# Patient Record
Sex: Female | Born: 1935 | Race: White | Hispanic: No | Marital: Married | Smoking: Former smoker
Health system: Southern US, Community
[De-identification: ages and names within clinical notes are randomized; demographics above are authoritative.]

## PROBLEM LIST (undated history)

## (undated) DIAGNOSIS — K5900 Constipation, unspecified: Secondary | ICD-10-CM

## (undated) DIAGNOSIS — K635 Polyp of colon: Secondary | ICD-10-CM

## (undated) DIAGNOSIS — G309 Alzheimer's disease, unspecified: Secondary | ICD-10-CM

## (undated) DIAGNOSIS — F32A Depression, unspecified: Secondary | ICD-10-CM

## (undated) DIAGNOSIS — F39 Unspecified mood [affective] disorder: Secondary | ICD-10-CM

## (undated) DIAGNOSIS — C44310 Basal cell carcinoma of skin of unspecified parts of face: Secondary | ICD-10-CM

## (undated) DIAGNOSIS — K5790 Diverticulosis of intestine, part unspecified, without perforation or abscess without bleeding: Secondary | ICD-10-CM

## (undated) DIAGNOSIS — Z8669 Personal history of other diseases of the nervous system and sense organs: Secondary | ICD-10-CM

## (undated) DIAGNOSIS — G40909 Epilepsy, unspecified, not intractable, without status epilepticus: Secondary | ICD-10-CM

## (undated) DIAGNOSIS — M810 Age-related osteoporosis without current pathological fracture: Secondary | ICD-10-CM

## (undated) DIAGNOSIS — H11001 Unspecified pterygium of right eye: Secondary | ICD-10-CM

## (undated) DIAGNOSIS — K219 Gastro-esophageal reflux disease without esophagitis: Secondary | ICD-10-CM

## (undated) DIAGNOSIS — M4800 Spinal stenosis, site unspecified: Secondary | ICD-10-CM

## (undated) DIAGNOSIS — F329 Major depressive disorder, single episode, unspecified: Secondary | ICD-10-CM

## (undated) DIAGNOSIS — I10 Essential (primary) hypertension: Secondary | ICD-10-CM

## (undated) DIAGNOSIS — M1990 Unspecified osteoarthritis, unspecified site: Secondary | ICD-10-CM

## (undated) DIAGNOSIS — F0280 Dementia in other diseases classified elsewhere without behavioral disturbance: Secondary | ICD-10-CM

## (undated) DIAGNOSIS — M549 Dorsalgia, unspecified: Secondary | ICD-10-CM

## (undated) DIAGNOSIS — R2681 Unsteadiness on feet: Secondary | ICD-10-CM

## (undated) DIAGNOSIS — E785 Hyperlipidemia, unspecified: Secondary | ICD-10-CM

## (undated) HISTORY — DX: Personal history of other diseases of the nervous system and sense organs: Z86.69

## (undated) HISTORY — DX: Age-related osteoporosis without current pathological fracture: M81.0

## (undated) HISTORY — DX: Dementia in other diseases classified elsewhere, unspecified severity, without behavioral disturbance, psychotic disturbance, mood disturbance, and anxiety: F02.80

## (undated) HISTORY — DX: Alzheimer's disease, unspecified: G30.9

## (undated) HISTORY — DX: Unspecified mood (affective) disorder: F39

## (undated) HISTORY — DX: Gastro-esophageal reflux disease without esophagitis: K21.9

## (undated) HISTORY — DX: Essential (primary) hypertension: I10

## (undated) HISTORY — DX: Basal cell carcinoma of skin of unspecified parts of face: C44.310

## (undated) HISTORY — DX: Diverticulosis of intestine, part unspecified, without perforation or abscess without bleeding: K57.90

## (undated) HISTORY — DX: Dorsalgia, unspecified: M54.9

## (undated) HISTORY — DX: Unsteadiness on feet: R26.81

## (undated) HISTORY — DX: Polyp of colon: K63.5

## (undated) HISTORY — DX: Dementia in other diseases classified elsewhere without behavioral disturbance: F02.80

## (undated) HISTORY — DX: Unspecified pterygium of right eye: H11.001

## (undated) HISTORY — DX: Epilepsy, unspecified, not intractable, without status epilepticus: G40.909

## (undated) HISTORY — DX: Spinal stenosis, site unspecified: M48.00

## (undated) HISTORY — DX: Major depressive disorder, single episode, unspecified: F32.9

## (undated) HISTORY — PX: CATARACT EXTRACTION: SUR2

## (undated) HISTORY — DX: Hyperlipidemia, unspecified: E78.5

## (undated) HISTORY — DX: Constipation, unspecified: K59.00

## (undated) HISTORY — DX: Depression, unspecified: F32.A

## (undated) HISTORY — DX: Unspecified osteoarthritis, unspecified site: M19.90

---
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
## 1998-01-13 ENCOUNTER — Other Ambulatory Visit: Admission: RE | Admit: 1998-01-13 | Discharge: 1998-01-13 | Payer: Self-pay | Admitting: Endocrinology

## 1998-06-22 ENCOUNTER — Encounter: Admission: RE | Admit: 1998-06-22 | Discharge: 1998-07-07 | Payer: Self-pay | Admitting: Anesthesiology

## 1998-12-02 ENCOUNTER — Emergency Department (HOSPITAL_COMMUNITY): Admission: EM | Admit: 1998-12-02 | Discharge: 1998-12-03 | Payer: Self-pay | Admitting: Emergency Medicine

## 1999-05-14 ENCOUNTER — Encounter: Payer: Self-pay | Admitting: Gastroenterology

## 1999-05-14 ENCOUNTER — Ambulatory Visit (HOSPITAL_COMMUNITY): Admission: RE | Admit: 1999-05-14 | Discharge: 1999-05-14 | Payer: Self-pay | Admitting: Gastroenterology

## 1999-08-12 ENCOUNTER — Encounter: Admission: RE | Admit: 1999-08-12 | Discharge: 1999-08-12 | Payer: Self-pay

## 1999-08-23 ENCOUNTER — Encounter: Payer: Self-pay | Admitting: Internal Medicine

## 1999-08-23 ENCOUNTER — Encounter: Admission: RE | Admit: 1999-08-23 | Discharge: 1999-08-23 | Payer: Self-pay | Admitting: Internal Medicine

## 2000-10-24 ENCOUNTER — Other Ambulatory Visit: Admission: RE | Admit: 2000-10-24 | Discharge: 2000-10-24 | Payer: Self-pay | Admitting: Internal Medicine

## 2001-01-29 ENCOUNTER — Encounter: Admission: RE | Admit: 2001-01-29 | Discharge: 2001-03-06 | Payer: Self-pay | Admitting: Neurology

## 2001-04-23 ENCOUNTER — Encounter: Admission: RE | Admit: 2001-04-23 | Discharge: 2001-05-31 | Payer: Self-pay | Admitting: Neurology

## 2002-01-18 ENCOUNTER — Encounter: Payer: Self-pay | Admitting: Internal Medicine

## 2002-02-01 HISTORY — PX: COLONOSCOPY: SHX174

## 2002-09-04 ENCOUNTER — Encounter: Admission: RE | Admit: 2002-09-04 | Discharge: 2002-12-03 | Payer: Self-pay

## 2002-12-17 ENCOUNTER — Encounter
Admission: RE | Admit: 2002-12-17 | Discharge: 2003-03-17 | Payer: Self-pay | Admitting: Physical Medicine & Rehabilitation

## 2003-01-06 ENCOUNTER — Encounter: Payer: Self-pay | Admitting: Physical Medicine & Rehabilitation

## 2003-01-06 ENCOUNTER — Ambulatory Visit (HOSPITAL_COMMUNITY)
Admission: RE | Admit: 2003-01-06 | Discharge: 2003-01-06 | Payer: Self-pay | Admitting: Physical Medicine & Rehabilitation

## 2003-01-17 ENCOUNTER — Encounter: Payer: Self-pay | Admitting: Physical Medicine & Rehabilitation

## 2003-01-17 ENCOUNTER — Encounter
Admission: RE | Admit: 2003-01-17 | Discharge: 2003-01-17 | Payer: Self-pay | Admitting: Physical Medicine & Rehabilitation

## 2003-01-17 ENCOUNTER — Encounter: Payer: Self-pay | Admitting: Diagnostic Radiology

## 2003-02-05 ENCOUNTER — Encounter
Admission: RE | Admit: 2003-02-05 | Discharge: 2003-02-05 | Payer: Self-pay | Admitting: Physical Medicine & Rehabilitation

## 2003-02-05 ENCOUNTER — Encounter: Payer: Self-pay | Admitting: Physical Medicine & Rehabilitation

## 2003-02-20 ENCOUNTER — Encounter
Admission: RE | Admit: 2003-02-20 | Discharge: 2003-02-20 | Payer: Self-pay | Admitting: Physical Medicine & Rehabilitation

## 2003-02-20 ENCOUNTER — Encounter: Payer: Self-pay | Admitting: Physical Medicine & Rehabilitation

## 2003-03-04 ENCOUNTER — Encounter: Payer: Self-pay | Admitting: Physical Medicine & Rehabilitation

## 2003-03-04 ENCOUNTER — Encounter
Admission: RE | Admit: 2003-03-04 | Discharge: 2003-03-04 | Payer: Self-pay | Admitting: Physical Medicine & Rehabilitation

## 2004-04-10 ENCOUNTER — Ambulatory Visit (HOSPITAL_COMMUNITY): Admission: EM | Admit: 2004-04-10 | Discharge: 2004-04-10 | Payer: Self-pay | Admitting: Emergency Medicine

## 2004-04-10 ENCOUNTER — Encounter (INDEPENDENT_AMBULATORY_CARE_PROVIDER_SITE_OTHER): Payer: Self-pay | Admitting: Specialist

## 2004-04-26 ENCOUNTER — Ambulatory Visit (HOSPITAL_COMMUNITY): Admission: RE | Admit: 2004-04-26 | Discharge: 2004-04-26 | Payer: Self-pay | Admitting: Gastroenterology

## 2004-05-17 ENCOUNTER — Ambulatory Visit (HOSPITAL_COMMUNITY): Admission: RE | Admit: 2004-05-17 | Discharge: 2004-05-17 | Payer: Self-pay | Admitting: Gastroenterology

## 2004-08-11 ENCOUNTER — Ambulatory Visit: Payer: Self-pay | Admitting: Internal Medicine

## 2004-08-13 ENCOUNTER — Ambulatory Visit: Payer: Self-pay | Admitting: Internal Medicine

## 2004-08-16 ENCOUNTER — Ambulatory Visit: Payer: Self-pay | Admitting: Internal Medicine

## 2004-09-06 ENCOUNTER — Ambulatory Visit: Payer: Self-pay | Admitting: Internal Medicine

## 2004-10-11 ENCOUNTER — Ambulatory Visit (HOSPITAL_COMMUNITY): Admission: RE | Admit: 2004-10-11 | Discharge: 2004-10-11 | Payer: Self-pay | Admitting: Gastroenterology

## 2004-10-11 ENCOUNTER — Ambulatory Visit: Payer: Self-pay | Admitting: Gastroenterology

## 2004-11-09 ENCOUNTER — Ambulatory Visit: Payer: Self-pay | Admitting: Gastroenterology

## 2004-11-30 ENCOUNTER — Ambulatory Visit: Payer: Self-pay | Admitting: Internal Medicine

## 2005-01-11 ENCOUNTER — Ambulatory Visit: Payer: Self-pay | Admitting: Internal Medicine

## 2005-02-10 ENCOUNTER — Ambulatory Visit: Payer: Self-pay | Admitting: Internal Medicine

## 2005-03-01 ENCOUNTER — Ambulatory Visit: Payer: Self-pay | Admitting: Internal Medicine

## 2005-04-13 ENCOUNTER — Ambulatory Visit: Payer: Self-pay | Admitting: Internal Medicine

## 2005-05-25 ENCOUNTER — Ambulatory Visit: Payer: Self-pay | Admitting: Gastroenterology

## 2005-05-31 ENCOUNTER — Ambulatory Visit (HOSPITAL_COMMUNITY): Admission: RE | Admit: 2005-05-31 | Discharge: 2005-05-31 | Payer: Self-pay | Admitting: Gastroenterology

## 2005-07-05 ENCOUNTER — Ambulatory Visit: Payer: Self-pay | Admitting: Gastroenterology

## 2005-07-05 HISTORY — PX: ESOPHAGOGASTRODUODENOSCOPY: SHX1529

## 2005-07-14 ENCOUNTER — Ambulatory Visit: Payer: Self-pay | Admitting: Internal Medicine

## 2005-09-26 HISTORY — PX: EPIDURAL BLOCK INJECTION: SHX1516

## 2005-10-05 ENCOUNTER — Ambulatory Visit: Payer: Self-pay | Admitting: Internal Medicine

## 2005-10-09 ENCOUNTER — Encounter: Admission: RE | Admit: 2005-10-09 | Discharge: 2005-10-09 | Payer: Self-pay | Admitting: Internal Medicine

## 2005-10-11 ENCOUNTER — Encounter: Admission: RE | Admit: 2005-10-11 | Discharge: 2005-11-09 | Payer: Self-pay | Admitting: Internal Medicine

## 2005-10-12 ENCOUNTER — Ambulatory Visit: Payer: Self-pay | Admitting: Internal Medicine

## 2005-11-07 ENCOUNTER — Ambulatory Visit: Payer: Self-pay | Admitting: Internal Medicine

## 2005-11-16 ENCOUNTER — Ambulatory Visit: Payer: Self-pay | Admitting: Internal Medicine

## 2005-11-18 ENCOUNTER — Encounter: Admission: RE | Admit: 2005-11-18 | Discharge: 2005-11-18 | Payer: Self-pay | Admitting: Internal Medicine

## 2005-11-25 ENCOUNTER — Ambulatory Visit: Payer: Self-pay | Admitting: Internal Medicine

## 2005-12-02 ENCOUNTER — Encounter: Admission: RE | Admit: 2005-12-02 | Discharge: 2005-12-02 | Payer: Self-pay | Admitting: Internal Medicine

## 2005-12-19 ENCOUNTER — Encounter: Admission: RE | Admit: 2005-12-19 | Discharge: 2005-12-19 | Payer: Self-pay | Admitting: Internal Medicine

## 2006-01-11 ENCOUNTER — Ambulatory Visit: Payer: Self-pay | Admitting: Internal Medicine

## 2006-01-31 ENCOUNTER — Ambulatory Visit: Payer: Self-pay | Admitting: Internal Medicine

## 2006-02-24 ENCOUNTER — Ambulatory Visit: Payer: Self-pay | Admitting: Internal Medicine

## 2006-03-15 ENCOUNTER — Ambulatory Visit: Payer: Self-pay | Admitting: Internal Medicine

## 2006-03-24 ENCOUNTER — Ambulatory Visit: Payer: Self-pay | Admitting: Internal Medicine

## 2006-04-10 ENCOUNTER — Ambulatory Visit: Payer: Self-pay | Admitting: Internal Medicine

## 2006-05-01 ENCOUNTER — Ambulatory Visit: Payer: Self-pay

## 2006-05-15 ENCOUNTER — Ambulatory Visit: Payer: Self-pay | Admitting: Gastroenterology

## 2006-06-09 ENCOUNTER — Ambulatory Visit: Payer: Self-pay | Admitting: Internal Medicine

## 2006-06-23 ENCOUNTER — Ambulatory Visit: Payer: Self-pay | Admitting: Internal Medicine

## 2006-06-30 ENCOUNTER — Ambulatory Visit: Payer: Self-pay | Admitting: Family Medicine

## 2006-07-30 ENCOUNTER — Encounter
Admission: RE | Admit: 2006-07-30 | Discharge: 2006-07-30 | Payer: Self-pay | Admitting: Physical Medicine and Rehabilitation

## 2006-08-01 ENCOUNTER — Ambulatory Visit: Payer: Self-pay | Admitting: Internal Medicine

## 2006-08-11 ENCOUNTER — Encounter
Admission: RE | Admit: 2006-08-11 | Discharge: 2006-08-11 | Payer: Self-pay | Admitting: Physical Medicine and Rehabilitation

## 2006-09-12 ENCOUNTER — Ambulatory Visit: Payer: Self-pay | Admitting: Internal Medicine

## 2006-09-12 LAB — CONVERTED CEMR LAB
BUN: 11 mg/dL (ref 6–23)
Basophils Absolute: 0 10*3/uL (ref 0.0–0.1)
Basophils Relative: 0.1 % (ref 0.0–1.0)
CO2: 29 meq/L (ref 19–32)
Calcium: 9.6 mg/dL (ref 8.4–10.5)
Chloride: 96 meq/L (ref 96–112)
Creatinine, Ser: 0.9 mg/dL (ref 0.4–1.2)
Eosinophil percent: 1.6 % (ref 0.0–5.0)
Folate: 17.3 ng/mL
GFR calc non Af Amer: 64 mL/min
Glomerular Filtration Rate, Af Am: 77 mL/min/{1.73_m2}
Glucose, Bld: 114 mg/dL — ABNORMAL HIGH (ref 70–99)
HCT: 45.2 % (ref 36.0–46.0)
Hemoglobin: 14.9 g/dL (ref 12.0–15.0)
Lymphocytes Relative: 24.4 % (ref 12.0–46.0)
MCHC: 33.1 g/dL (ref 30.0–36.0)
MCV: 94 fL (ref 78.0–100.0)
Monocytes Absolute: 0.3 10*3/uL (ref 0.2–0.7)
Monocytes Relative: 6.8 % (ref 3.0–11.0)
Neutro Abs: 2.8 10*3/uL (ref 1.4–7.7)
Neutrophils Relative %: 67.1 % (ref 43.0–77.0)
Platelets: 234 10*3/uL (ref 150–400)
Potassium: 4.3 meq/L (ref 3.5–5.1)
RBC: 4.81 M/uL (ref 3.87–5.11)
RDW: 12.3 % (ref 11.5–14.6)
Sodium: 134 meq/L — ABNORMAL LOW (ref 135–145)
TSH: 1.17 microintl units/mL (ref 0.35–5.50)
Vitamin B-12: 236 pg/mL (ref 211–911)
WBC: 4.2 10*3/uL — ABNORMAL LOW (ref 4.5–10.5)

## 2006-10-13 ENCOUNTER — Ambulatory Visit: Payer: Self-pay | Admitting: Internal Medicine

## 2006-10-13 LAB — CONVERTED CEMR LAB
ALT: 15 units/L (ref 0–40)
AST: 23 units/L (ref 0–37)
Albumin: 3.9 g/dL (ref 3.5–5.2)
Alkaline Phosphatase: 75 units/L (ref 39–117)
BUN: 15 mg/dL (ref 6–23)
Bilirubin, Direct: 0.1 mg/dL (ref 0.0–0.3)
CO2: 29 meq/L (ref 19–32)
Calcium: 9.8 mg/dL (ref 8.4–10.5)
Chloride: 102 meq/L (ref 96–112)
Cholesterol: 259 mg/dL (ref 0–200)
Creatinine, Ser: 1 mg/dL (ref 0.4–1.2)
Direct LDL: 112 mg/dL
GFR calc Af Amer: 69 mL/min
GFR calc non Af Amer: 57 mL/min
Glucose, Bld: 97 mg/dL (ref 70–99)
HDL: 109.5 mg/dL (ref >39.0)
Potassium: 4.5 meq/L (ref 3.5–5.1)
Sodium: 138 meq/L (ref 135–145)
Total Bilirubin: 0.5 mg/dL (ref 0.3–1.2)
Total CHOL/HDL Ratio: 2.4
Total Protein: 6.5 g/dL (ref 6.0–8.3)
Triglycerides: 72 mg/dL (ref 0–149)
VLDL: 14 mg/dL (ref 0–40)

## 2006-12-01 ENCOUNTER — Ambulatory Visit: Payer: Self-pay | Admitting: Internal Medicine

## 2006-12-27 ENCOUNTER — Ambulatory Visit: Payer: Self-pay | Admitting: Internal Medicine

## 2007-03-26 ENCOUNTER — Ambulatory Visit: Payer: Self-pay | Admitting: Internal Medicine

## 2007-04-04 IMAGING — CT CT T SPINE W/O CM
4 series · 16 of 33 positions shown, 19 images · IV contrast (agent unspecified)
Comparison: None.

CLINICAL DATA: Back pain for several years.  No history of trauma.  Question HNP.
CT THORACIC SPINE WITHOUT CONTRAST:
TECHNIQUE: Multidetector CT imaging of the thoracic spine was performed.  Multiplanar CT image reconstructions were also generated.

[Series 100: t-spine w/o · axial · non-contrast · 0.39mm/px · z∈[-233,-138]mm · 3 of 115 slices shown (1 of 2)]
[im 20/115  bone]
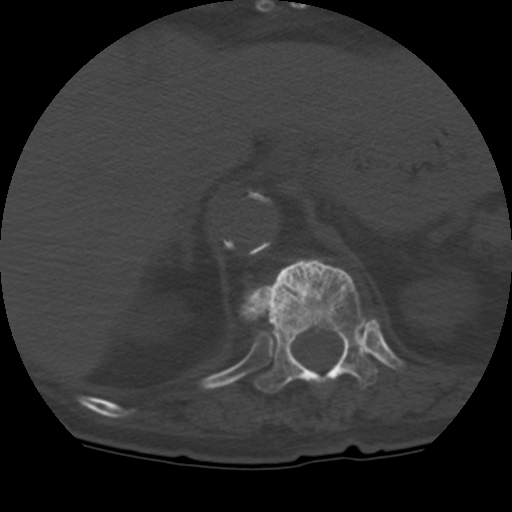
[im 39/115  bone]
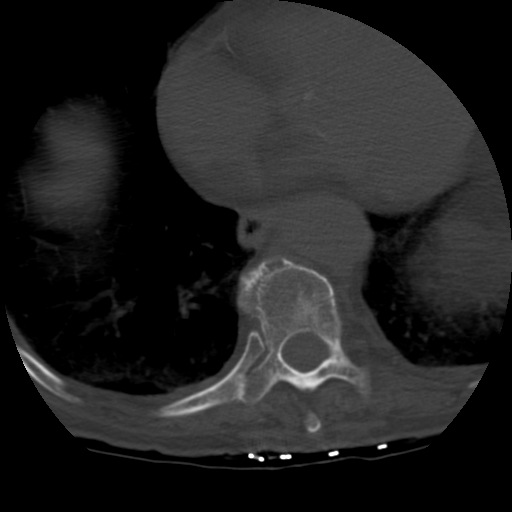
[im 58/115  bone]
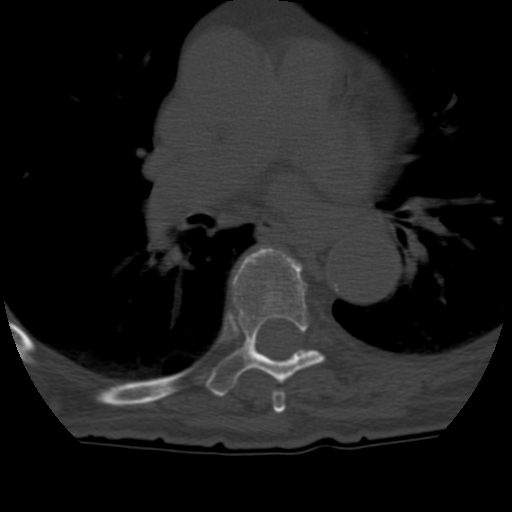

[Series 100: t-spine w/o · axial · non-contrast · 0.39mm/px · z∈[-233,-43]mm · 5 of 115 slices shown, 7 images (2 of 2)]
[im 20/115  soft-tissue]
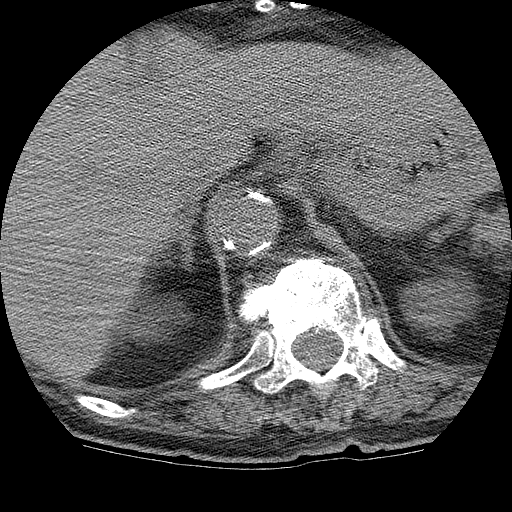
[im 20/115  bone]
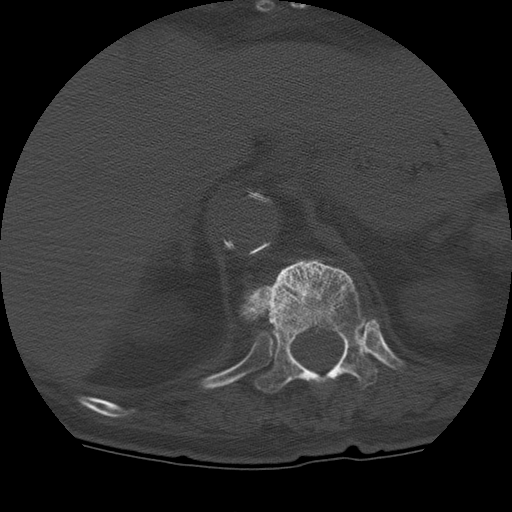
[im 39/115  bone]
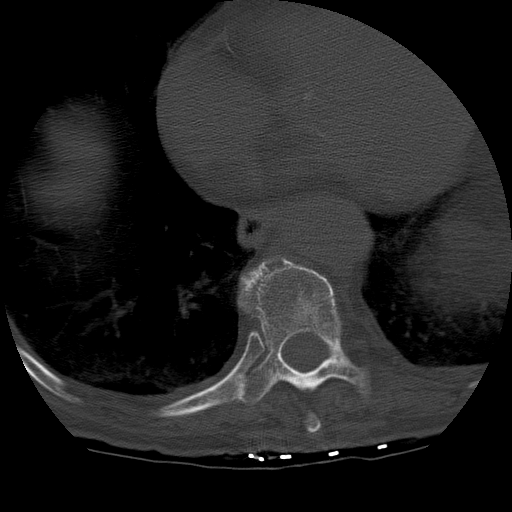
[im 58/115  bone]
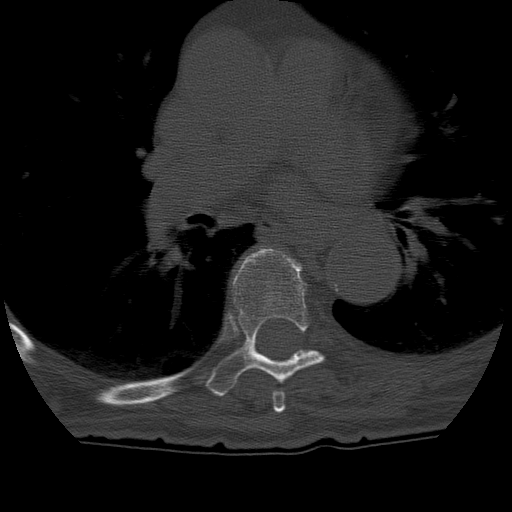
[im 77/115  bone]
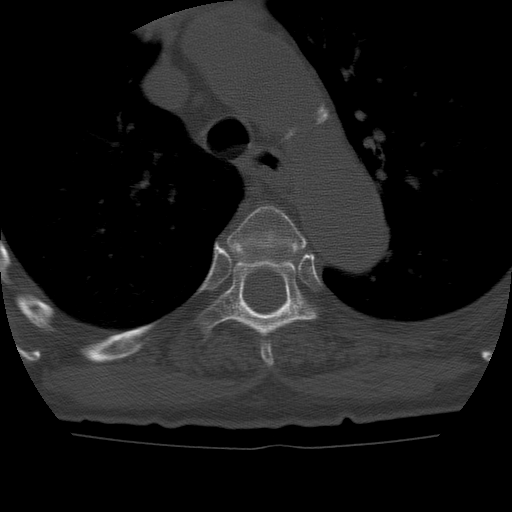
[im 96/115  soft-tissue]
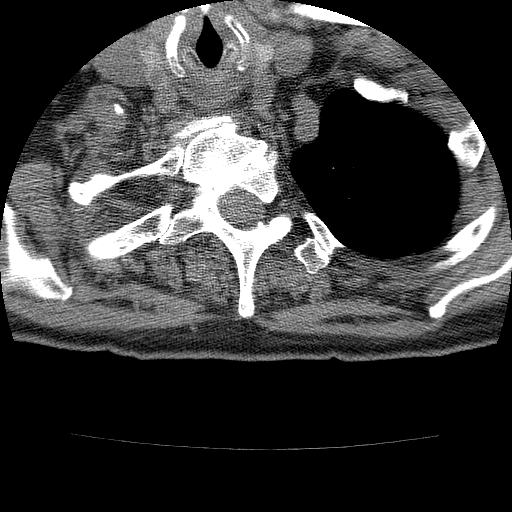
[im 96/115  bone]
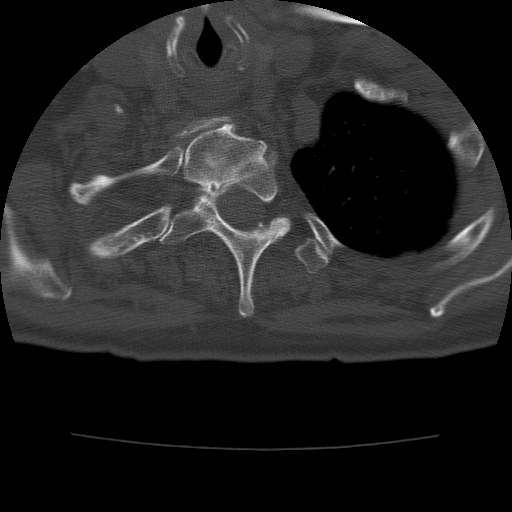

[Series 103: sagittal t-spine · sagittal · 0.56mm/px · 5 of 49 slices shown, 6 images]
[im 17/49  bone]
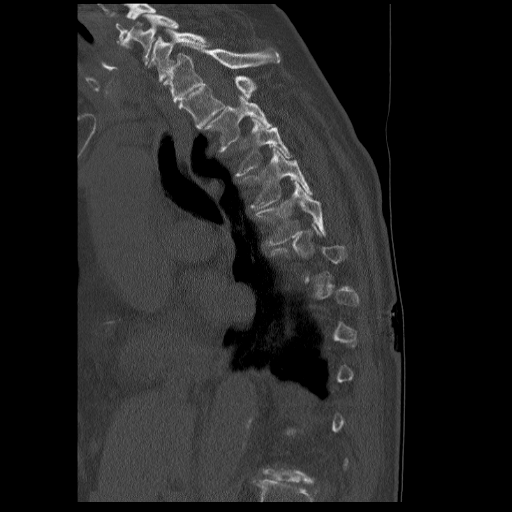
[im 21/49  bone]
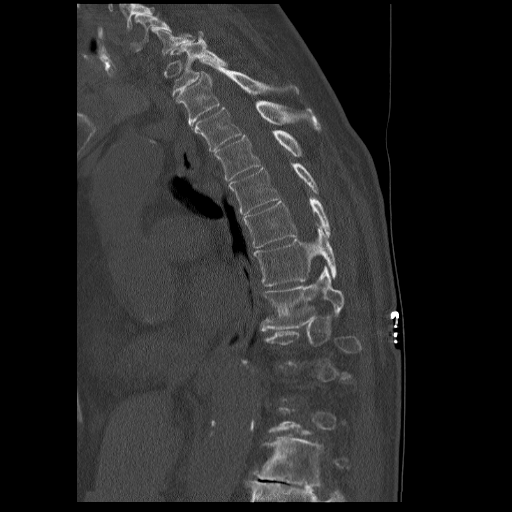
[im 25/49  soft-tissue]
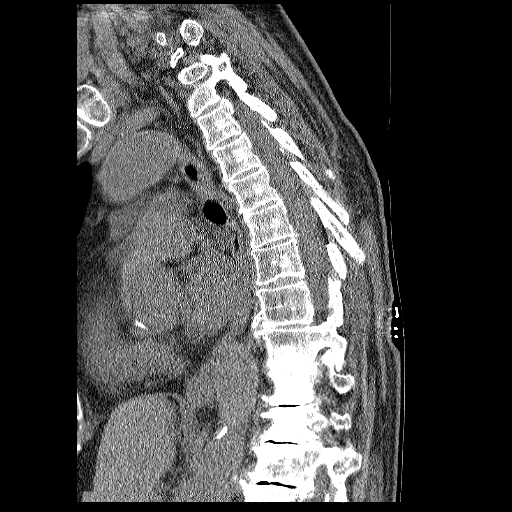
[im 25/49  bone]
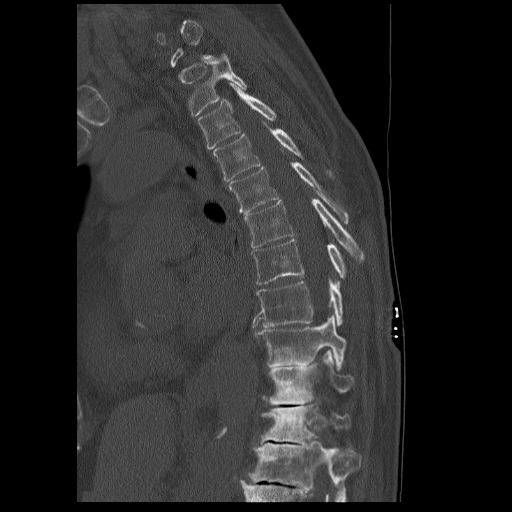
[im 29/49  bone]
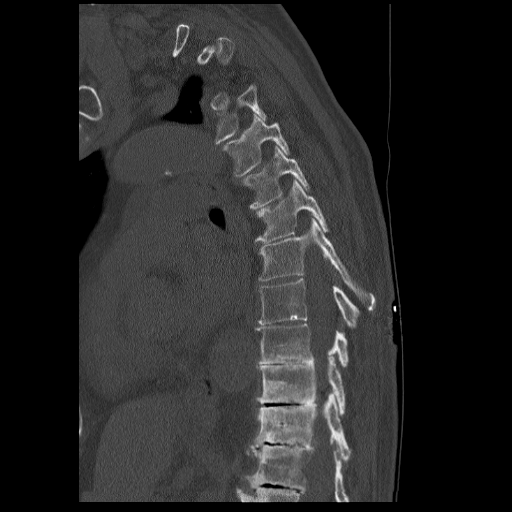
[im 33/49  bone]
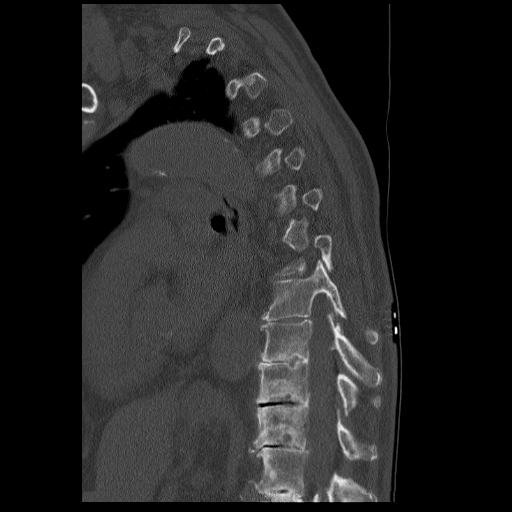

[Series 104: coronal t-spine · coronal · 0.56mm/px · 3 of 84 slices shown]
[im 17/84  bone]
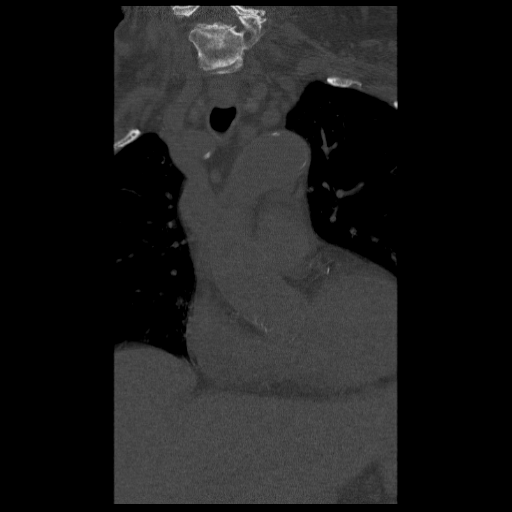
[im 34/84  bone]
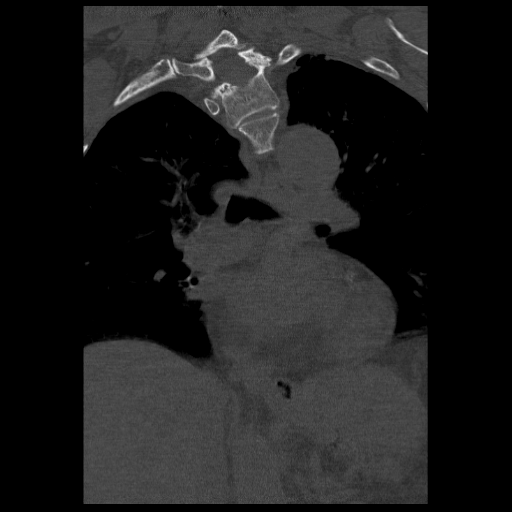
[im 50/84  bone]
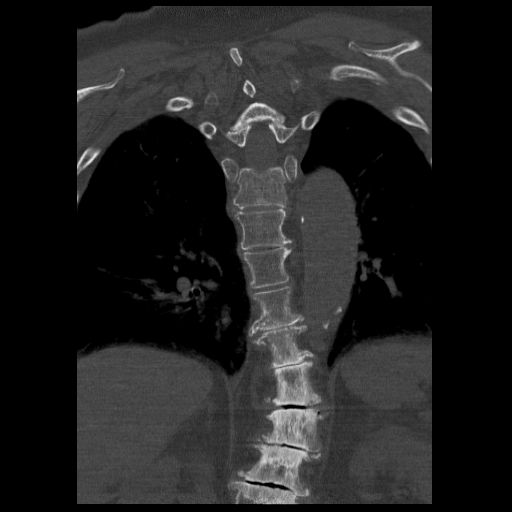

[16 of 33 positions shown; findings below may reference images not displayed]

FINDINGS: Motion artifact limits evaluation of the extrathoracic structures.  There is no paraspinal abnormality.  
Diminutive rib will be labeled T12 for the purposes of this study.  Extremely limited evaluation for disk abnormalities by unenhanced CT.  
There is moderate convex left spinal curvature centered about the lower thoracic spine.  
Severe lower cervical spondylosis and degenerative disk disease.  Maintenance of thoracic vertebral body height.  No evidence of anterolisthesis or retrolisthesis.  Mild upper thoracic spondylosis.  There is moderately severe lower thoracic spondylosis with degenerative disk disease and vacuum disk phenomenon at T9-10 through T12-L1.  This corresponds to the areas of most severe spinal curvature.
IMPRESSION: 1.  Moderately severe thoracic spinal curvature with advanced lower cervical and lower thoracic spondylosis and degenerative disk disease as described.
2.  No evidence of thoracic compression fracture.
3.  Severely limited evaluation for disk abnormalities secondary to unenhanced CT technique.  If intervertebral disk evaluation is desired, dedicated thoracic spine MRI is recommended.

## 2007-04-11 ENCOUNTER — Ambulatory Visit: Payer: Self-pay | Admitting: Internal Medicine

## 2007-04-11 ENCOUNTER — Encounter: Payer: Self-pay | Admitting: *Deleted

## 2007-04-12 ENCOUNTER — Encounter: Payer: Self-pay | Admitting: Internal Medicine

## 2007-05-29 ENCOUNTER — Encounter: Payer: Self-pay | Admitting: Internal Medicine

## 2007-06-20 ENCOUNTER — Ambulatory Visit: Payer: Self-pay | Admitting: Internal Medicine

## 2007-06-20 DIAGNOSIS — E785 Hyperlipidemia, unspecified: Secondary | ICD-10-CM | POA: Insufficient documentation

## 2007-06-20 DIAGNOSIS — F0280 Dementia in other diseases classified elsewhere without behavioral disturbance: Secondary | ICD-10-CM

## 2007-06-20 DIAGNOSIS — I10 Essential (primary) hypertension: Secondary | ICD-10-CM

## 2007-06-20 DIAGNOSIS — F39 Unspecified mood [affective] disorder: Secondary | ICD-10-CM

## 2007-06-20 HISTORY — DX: Dementia in other diseases classified elsewhere, unspecified severity, without behavioral disturbance, psychotic disturbance, mood disturbance, and anxiety: F02.80

## 2007-06-21 ENCOUNTER — Telehealth: Payer: Self-pay | Admitting: Internal Medicine

## 2007-07-06 ENCOUNTER — Ambulatory Visit: Payer: Self-pay | Admitting: Internal Medicine

## 2007-08-02 ENCOUNTER — Telehealth: Payer: Self-pay | Admitting: Internal Medicine

## 2007-08-31 ENCOUNTER — Encounter: Payer: Self-pay | Admitting: Internal Medicine

## 2007-09-11 ENCOUNTER — Ambulatory Visit: Payer: Self-pay | Admitting: Internal Medicine

## 2007-10-02 ENCOUNTER — Ambulatory Visit: Payer: Self-pay | Admitting: Internal Medicine

## 2007-10-19 ENCOUNTER — Ambulatory Visit: Payer: Self-pay | Admitting: Internal Medicine

## 2007-10-19 DIAGNOSIS — M545 Low back pain, unspecified: Secondary | ICD-10-CM | POA: Insufficient documentation

## 2007-10-22 LAB — CONVERTED CEMR LAB
CO2: 29 meq/L (ref 19–32)
Calcium: 9.1 mg/dL (ref 8.4–10.5)
Chloride: 99 meq/L (ref 96–112)
Cholesterol: 222 mg/dL (ref 0–200)
Creatinine, Ser: 0.9 mg/dL (ref 0.4–1.2)
GFR calc non Af Amer: 64 mL/min
Potassium: 5.1 meq/L (ref 3.5–5.1)
Sodium: 135 meq/L (ref 135–145)
TSH: 1.23 microintl units/mL (ref 0.35–5.50)
Total CHOL/HDL Ratio: 2.2
VLDL: 12 mg/dL (ref 0–40)

## 2007-10-23 ENCOUNTER — Telehealth: Payer: Self-pay | Admitting: Internal Medicine

## 2007-10-31 ENCOUNTER — Telehealth: Payer: Self-pay | Admitting: Internal Medicine

## 2007-11-19 ENCOUNTER — Ambulatory Visit: Payer: Self-pay | Admitting: Internal Medicine

## 2008-02-19 ENCOUNTER — Ambulatory Visit: Payer: Self-pay | Admitting: Internal Medicine

## 2008-03-19 ENCOUNTER — Telehealth: Payer: Self-pay | Admitting: Internal Medicine

## 2008-06-18 ENCOUNTER — Ambulatory Visit: Payer: Self-pay | Admitting: Internal Medicine

## 2009-01-02 ENCOUNTER — Encounter: Payer: Self-pay | Admitting: Internal Medicine

## 2010-10-16 ENCOUNTER — Encounter: Payer: Self-pay | Admitting: Gastroenterology

## 2011-02-08 NOTE — Assessment & Plan Note (Signed)
[facility]                         GASTROENTEROLOGY OFFICE NOTE   Veronica Sanchez, Veronica Sanchez                        MRN:          [id number]  DATE:09/11/2007                            DOB:          Jun 19, 1924    CHIEF COMPLAINT:  Yearly checkup.  Previously seen by Dr. Corinda Gubler.  Has  reflux.   She had a little bit of midsternal pain while eating a hamburger about 2  days ago.  That is not a frequent problem.  I do not think the pain is  persistent.  She has dementia and history is a little bit tricky, though  her husband provides some.  There is no dysphagia that I can tell.  She  is taking Prevacid on a daily basis.   PAST MEDICAL HISTORY:  1. Gastroesophageal reflux disease with EGD showing esophageal      stricture July 05, 2005, dilated to 16 [place] with a Acupuncturist.  There is a 4 cm hiatal hernia as well.  Last previous EGD      February 2006.  2. History of esophageal dysmotility as well.  3. Colonoscopy Feb 01, 2002 with AVM in the ascending colon, 3 mm polyp      destroyed in the ascending colon, AVM in the transverse colon,      diverticulosis.  4. Alzheimer's disease.  5. Seizure disorder.  6. Hypertension.  7. Prior history of colon polyps.  8. Previous diagnosis of cricopharyngeal dysfunction.  9. Previous history of food impaction.   MEDICATIONS:  Listed and reviewed in the chart.   REVIEW OF SYSTEMS:  As above.   PHYSICAL EXAM:  Height 5 feet, weight 161 pounds, blood pressure 140/72.  She has a normal appearance.  She is awake and alert.  Orientation is  not completely tested.  EYES:  Anicteric.  HENT:  Normal mouth and posterior pharynx.  NECK:  Supple.  No thyromegaly or mass.  CHEST:  Clear.  HEART:  S1, S2.  No murmurs, rubs, or gallops.  ABDOMEN:  Soft and nontender.  No organomegaly or mass.  No chest wall  tenderness detected.   ASSESSMENT:  Gastroesophageal reflux disease with a history of  esophageal  stricture and dysmotility.  She is not having dysphagia.  She  had some vague brief chest pain.  There is a hiatal hernia as well.   PLAN:  Her husband reports that the Prevacid is quite expensive.  We are  going to try Prilosec over-the-counter 2 each day instead.  We will see  if that holds her.  We will see her as needed otherwise.     Iva Boop, MD,FACG  Electronically Signed    CEG/MedQ  DD: 09/12/2007  DT: 09/13/2007  Job #: [id number]   cc:   Tera Mater. Evlyn Kanner, M.D.

## 2011-02-11 NOTE — Consult Note (Signed)
NAMELAVERTA, HARNISCH                             ACCOUNT NO.:  [id number]   MEDICAL RECORD NO.:  [id number]                   PATIENT TYPE:  REC   LOCATION:  [facility]                                  FACILITY:  [facility]   PHYSICIAN:  Zachary George, DO                      DATE OF BIRTH:  1923/11/07   DATE OF CONSULTATION:  09/05/2002  DATE OF DISCHARGE:                                   CONSULTATION   Dear Dr. Cato Mulligan,   Thank you very much for kindly referring Ms. Nioma Bruington to the center for  pain and rehabilitative medicine for evaluation. Ms. Walthall was seen in our  clinic today. Please refer to the following for details regarding the  history and physical examination and treatment plan. Once again, thank you  for allowing [place] to participate in the care of Ms. Simkins.   CHIEF COMPLAINT:  Back pain.   HISTORY OF PRESENT ILLNESS:  Ms. Tarnow is a pleasant 77 year old female with  long-standing back pain. Currently, her pain is located in her left thoracic  paraspinous region, as she points to the T8-9 level. She denies any  radicular pain. She has had low back pain and neck pain previously, but this  has resolved with physical therapy. Physical therapy had not really helped  this thoracic pain for any length of time although she states that she does  get some temporary relief with a home exercise program, including rubber  band strengthening exercises. She has also been involved in aquatic therapy  but has a fear of the water and also notes that it really was not helping  her any more than her current home exercise program. She also notes that she  has had some previous cramping in her feet, calves, and hands, and this has  been relieved with the addition of nortriptyline 20 mg at bedtime as  prescribed by Dr. Sandria Manly, her neurologist, who also follows her for chronic  seizure disorder. She has also been followed by Dr. Jimmy Footman,  rheumatologist, who has been prescribing Arthrotec. Her pain today is  9/10  on a subjective scale. Function and quality of life indices have declined.  Her pain is described as constant, sharp, with occasional weakness. Symptoms  are worse with walking, bending, working, and improved with heat and  minimally with medications. She has tried various medications including  multiple nonsteroidal anti-inflammatory medicines including Aleve, Mobic,  Celebrex, Vioxx which she could not tolerate. I reviewed the health and  history form and 14-point review of systems. The patient admits to  unexplained fatigue, cataracts, hypertension, osteoarthritis, easy bruising,  allergy to wool, memory loss, and seizure disorder.   PAST MEDICAL HISTORY:  Seizure disorder, hypertension.   PAST SURGICAL HISTORY:  Cataract surgery.   FAMILY HISTORY:  Hypertension.   SOCIAL HISTORY:  The patient denies smoking. Admits to drinking one  glass of  wine per day. She denies illicit drug use. She is married and not currently  working.   ALLERGIES:  Codeine causes a headache and amoxicillin causes a rash. She is  unable to tolerate Aleve, Mobic, Celebrex, Vioxx.   MEDICATIONS:  1. Phenobarbital.  2. Dilantin.  3. Triamterene.  4. Arthrotec.  5. Folic acid.  6. Calcium with vitamin D.  7. Nortriptyline 20 mg at bedtime  8. Norvasc.   PHYSICAL EXAMINATION:  Reveals a healthy female in no acute distress. Blood  pressure is 188/88, pulse 80, respirations 20, O2 saturation is 95% on room  air. Examination of the patient's back reveals decreased right hemipelvis of  approximately 3/8 of an inch. She also has decreased right shoulder height  compared to the left. These are corrected with heel lift. The patient is  noted to have scoliosis in her lumbar spine. Palpatory examination reveals  significant tenderness to palpation in the left paraspinous muscles at T8-9  level, reproducing patient's symptoms. Range of motion of the lumbar and  thoracic spine is within normal limits;  however, she does get some  discomfort on flexion. No significant discomfort on side bending, rotation,  or extension. Manual muscle testing is 5/5 bilateral upper and lower  extremities. Sensory examination is intact to light touch bilateral upper  and lower extremities. Muscle stretch reflexes are 1+/4 bilateral biceps,  brachial radialis, pronator teres, patellar, medial hamstrings, and 0/4  bilateral triceps and Achilles. Straight leg raise, Pearlean Brownie, and Lhermitte  tests are negative. No heat, erythema, or edema in the upper or lower  extremities. Positive pes planus bilaterally.   IMPRESSION:  1. Myofascial pain syndrome with trigger point in the left thoracic     paraspinous muscle. This is possibly due to biomechanical balances with     relative leg length discrepancy secondary to scoliosis.  2. Scoliosis.  3. Leg length discrepancy.  4. Pes planus.   PLAN:  1. Ms. Reddish and I had a long discussion regarding treatment options. I spent     considerable time describing her biomechanical imbalances and strategies     to correct them. Initially, I would like to obtain records to include MRI     reports of her spine from [facility].  2. Will prescribe a 3/8 inch heel lift for her right shoe to be used slowly     starting with one hour a day up to eight hours as day as tolerated.  3. Recommend trial of trigger point injections to the right thoracic     paraspinous muscles if symptoms are not improving. Would like to give her     two to four weeks with the heel lift to see if she notices any     improvement.  4. The patient to return to clinic in two to four weeks for reevaluation and     possible trigger point injections as predicated upon patient's symptoms     and response to heel lift.   The patient was educated about findings and recommendations and understands.  There were no barriers to communication. This was a 45-minute evaluation.                                              Zachary George, DO    JW/MEDQ  D:  09/05/2002  T:  09/06/2002  Job:  [id number]   cc:   Valetta Mole. Swords, M.D. [facility]  [address] [place]  [place] [address]  Fax: 1

## 2011-02-11 NOTE — Consult Note (Signed)
Veronica Sanchez, Veronica Sanchez                             ACCOUNT NO.:  [id number]   MEDICAL RECORD NO.:  [id number]                   PATIENT TYPE:   LOCATION:                                       FACILITY:   PHYSICIAN:  Zachary George, DO                      DATE OF BIRTH:  [birth year]   DATE OF CONSULTATION:  10/29/2002  DATE OF DISCHARGE:                                   CONSULTATION   REASON FOR CONSULTATION:  Ms. Merriman returns to clinic today for a  reevaluation.   HISTORY OF PRESENT ILLNESS:  She was last seen on October 11, 2002 at which  time she underwent repeat trigger point injection to her left thoracic  paraspinous muscles.  She states she got no relief at all with the trigger  point injection.  She states that the injection gave her a headache for a  day.  I believe this is from the small amount of steroid that was used in  the solution and this was discussed with the patient.  She states that she  believes that she has had similar response from lumbar epidural steroid  injections that she had approximately five years ago but did not make the  connection with the steroid.  She continues to have significant left  thoracic paraspinous pain and rates it an 8/10 on a subjective scale.  She  has used Lidoderm patches sparingly and states that they have been somewhat  helpful.  She also continues on Arthrotec which does help as well.   REVIEW OF SYSTEMS:  I reviewed the health and history form and 14-point  review of systems.  The patient denies any new neurologic complaints.   PHYSICAL EXAMINATION:  GENERAL:  A healthy-appearing female in no acute  distress.  Blood pressure 167/78, pulse 79, respirations 20.  O2 saturation  is 97% on room air.  BACK:  Examination of the back reveals scoliosis with significant tenderness  to palpation in the left thoracic paraspinous muscles.  The trigger point  seems to improve with direct palpation.  EXTREMITIES:  No new neurologic findings in the  upper and lower extremities.   IMPRESSION:  1. Thoracic back pain, chronic, myofascial and mechanical.  I suspect that     the patient has underlying spondylosis contributing to her myofascial     component.  She has not had any significant improvement following trigger     point injections x2.  2. Scoliosis.  This is likely contributing to the patient's symptoms.   PLAN:  1. This was an extensive consultation with Ms. Louison.  I spent approximately     25 minutes with her discussing further treatment options.  I do not feel     that further trigger point injection therapy is warranted at this time as     the patient has had minimal response if  any to trigger point injections     x2.  Would give consideration for thoracic facet joint injections     although I counseled her that she may have a similar response to steroid     mixture used in the facet joint injections and that this may not really     be an option for her.  Would consider physical therapy but she has had     this previously without any benefit.  I would recommend a program     consisting of myofascial release and other manual techniques to assist     her.  It does not sound like she has had that type of therapy in the     past.  I would consider massage therapy although she has had massage in     the past and really does not like massage therapy so much.  Other options     include medications.  At this time, I will have her continue with     Arthrotec as needed as well as Lidoderm patches which have been helpful     for her.  I have provided her with #5 sample packs of Lidoderm today and     she has a prescription.  2. Would consider low-dose hydrocodone as needed for severe pain.  The     patient states that she has hydrocodone at home but has not taken it in a     long time.  I counseled her on the potential side effects as well as the     potential for addiction and the habituating nature of the medication and     she  understands.  The patient would like to give physical therapy a     consideration and if she should like to proceed I will refer her to Sandi Raveling, PT.  3. The patient to return to clinic in two months for a reevaluation or     sooner if needed.  4. The patient to follow up with her primary care Shekera Beavers.   The patient was educated on above findings and recommendations and  understands.  There were no barriers to communication.                                               Zachary George, DO    JW/MEDQ  D:  10/29/2002  T:  10/29/2002  Job:  [id number]   cc:   Valetta Mole. Swords, M.D. [facility]  [address] [place]  [place] [address]  Fax: 1

## 2011-02-11 NOTE — Consult Note (Signed)
Veronica Sanchez, SHULTIS                             ACCOUNT NO.:  [id number]   MEDICAL RECORD NO.:  [id number]                   PATIENT TYPE:  REC   LOCATION:  [facility]                                  FACILITY:  [facility]   PHYSICIAN:  Zachary George, DO                      DATE OF BIRTH:  [birth year]   DATE OF CONSULTATION:  DATE OF DISCHARGE:                                   CONSULTATION   HISTORY:  Ms. Waggoner returns to clinic today for re-evaluation and trigger  point injections to the left thoracic paraspinous muscles.  The patient was  initially evaluated on 09/05/02.  At that time I prescribed a 3/8 inch heel  lift for her right shoe secondary to leg length discrepancy.  Patient states  that she wore the heel lift as prescribed, gradually increasing the duration  of time of use however, she notes increased pain in her lower back and lower  extremities now which she did not have previously.  I suspect this is  secondary to biomechanical changes.  Her pain today is 8/10 on the  subjective scale.  She continues to complain of severe pain in her left  thoracic paraspinous region.  I reviewed the health and history form with 14  point review of systems.   PHYSICAL EXAMINATION:  Reveals a healthy appearing female in no acute  distress.  VITALS:  Blood pressure 179/84, pulse 81, respirations 17, oxygen saturation  96% on room air.  NEUROLOGIC:  No new neurologic findings in her lower extremities.  There is  significant tenderness to palpation in the left thoracic paraspinous muscles  with a ropey texture reproducing patient's symptoms.   IMPRESSION:  1. Mild fascial pain syndrome with trigger points in the left thoracic     paraspinous muscles.  2. Scoliosis.  3. Leg length discrepancy.  4. Pes planus.   PLAN:  1. Trigger point injection to the left thoracic paraspinous muscles.  2. Discontinue heel lift for now.  3. Patient to return to clinic in two weeks for re-evaluation and possible    repeat trigger point injections as predicated upon patient's response and     symptoms.   PROCEDURE:  Trigger point injections to the left thoracic paraspinous muscle  x2 sites.  Procedure was described to the patient in detail including risks,  benefits, limitations and alternatives.  Patient wishes to proceed.  The  skin was prepped in the usual fashion with alcohol swabs.  Each trigger  point was injected with 0.5 cc of 1% lidocaine plus 0.5 cc of 0.25%  Bupivacaine using a 25 gauge 1 1/4 inch needle.  There were twitch responses  noted.  Patient tolerated the procedure well.    DISCHARGE INSTRUCTIONS:  Discharge instructions given.  Patient was educated  of the above findings and recommendations and understands.  There were no  barriers  to communication.                                               Zachary George, DO    JW/MEDQ  D:  09/27/2002  T:  09/27/2002  Job:  [id number]   cc:   Zachary George, DO  [address]  [place]  [place] [address]  Fax: 1   Bruce H. Swords, M.D. [facility]  [address] [place]  [place] [address]  Fax: 1

## 2011-02-11 NOTE — Consult Note (Signed)
Veronica Sanchez, Veronica Sanchez                             ACCOUNT NO.:  [id number]   MEDICAL RECORD NO.:  [id number]                   PATIENT TYPE:  REC   LOCATION:  [facility]                                  FACILITY:  [facility]   PHYSICIAN:  Zachary George, DO                      DATE OF BIRTH:  1924/09/17   DATE OF CONSULTATION:  10/11/2002  DATE OF DISCHARGE:                                   CONSULTATION   REASON FOR CONSULTATION:  The patient returns to clinic today for  reevaluation and possible repeat trigger point injections.  She was last  seen on September 27, 2002, at which time she underwent trigger point  injections to her left thoracic paraspinous muscles, which she states gave  her two to three days of complete relief of her thoracic back pain.  Her  pain is back to baseline today and rates it as an 8/10 on subjective scale.  I reviewed health and history form and 14-point review of systems.   PHYSICAL EXAMINATION:  GENERAL:  Physical exam reveals a healthy female in  no acute distress.  VITAL SIGNS:  Blood pressure is 164/73, pulse 83, respirations 16.  O2  saturation is 98% on room air.  BACK:  Examination of the back reveals tenderness to palpation with trigger  points in the thoracic paraspinous muscles.   IMPRESSION:  1. Myofascial pain syndrome with trigger points in the left thoracic     paraspinous muscles.  2. Scoliosis.  3. Leg length discrepancy.  4. Pes planus.   PLAN:  1. I discussed further treatment options with the patient.  At this time, it     is reasonable to repeat trigger point injections to see if we can get a     longer response.  She wishes to proceed with this.  2. If the patient is not getting any long-term benefit from the trigger     point injections, would consider thoracic facet joint injections as I     suspect she has a component of thoracic facet arthropathy, given her     scoliosis.  3. I will prescribe Lidoderm patches to use on her thoracic spine.   I     provide her with sample patches and a prescription for #30 to apply up to     12 hours, maximum of 3 patches at a time, 1 refill.  4. Patient to return to clinic in two weeks for reevaluation and possible     repeat trigger point injections as predicated upon the patient's response     and symptoms.   PROCEDURE:  Trigger point injections, left thoracic paraspinal muscles, x2  sites.   INFORMED CONSENT:  Procedure was described to patient in detail including  risks, benefits, limitations and alternatives.  The patient wishes to  proceed.   DESCRIPTION OF PROCEDURE:  Skin was  prepped in the usual fashion with  alcohol swabs.  Each trigger point was injected with 0.25 cc of Kenalog 40  mg/cc plus 0.5 cc of 1% lidocaine plus 0.25 cc of 0.25% bupivacaine using a  25-gauge 1-1/4-inch needle to which responses were noted.  Patient tolerated  the procedure well.   Discharge instructions were given.   Patient was educated in the above findings and recommendations and  understands.  There were no barriers to communication.                                               Zachary George, DO    JW/MEDQ  D:  10/11/2002  T:  10/11/2002  Job:  [id number]   cc:   Valetta Mole. Swords, M.D. [facility]  [address] [place]  [place] [address]  Fax: 1

## 2011-02-11 NOTE — Assessment & Plan Note (Signed)
[facility]                           GASTROENTEROLOGY OFFICE NOTE   Veronica Sanchez, Veronica Sanchez                        MRN:          [id number]  DATE:05/15/2006                            DOB:          1924/09/23    HISTORY OF PRESENT ILLNESS:  This nice lady comes in, says she is doing  fine.  She did get my recall letter and I wanted talk to her about her  condition and see how she is doing.  She denied any real symptoms of upper  GI problems with no dysphagia or difficulty chewing.  She has had a known  hiatal hernia with severe stricture in the past.  She has not been dilated  in the past few years and seems to be doing well with this.  As far as her  lower GI symptoms, she has no symptoms.  She did have multiple polyps years  ago when we colonoscoped her initially, and on follow-up examination,  approximately three since, she only had 1-2 very small polyps which were  removed.  Her last procedure was in 2003 and only one small 3 mm polyp was  cauterized.  There were several AVM and some diverticular disease.   PHYSICAL EXAMINATION:  VITAL SIGNS:  Vital signs were normal.  NECK:  Basically unremarkable.   IMPRESSION:  1. Gastrointestinal reflux disease with stricture, no real symptoms at      this time, on Prevacid.  2. Status post multiple colon polyps with negative family history of      gastrointestinal cancer.  3. Diverticulosis.  4. History of seizure disorder.  5. Hypertension.   RECOMMENDATIONS:  I told Veronica Sanchez that I did not think that we needed to do a  follow-up colonoscopic examination on her.  She is doing well.  She has  never had anything but really tiny polyps in the past on her procedures, and  I do not think she was anxious to do it.  She seemed not excited about  having this procedure done.  Therefore, I told her if things change we  probably will check colon screens on a yearly bases, if things change, if  her bowel habits  change, we could always proceed with this procedure.  I  told her there was a little risk not to, but I told her that overall she  would be fine.  She should continue on her Protonix on a daily basis,  Protonix for GERD.                                   Veronica Mort, MD   SML/MedQ  DD:  05/15/2006  DT:  05/16/2006  Job #:  [id number]

## 2012-07-26 LAB — BASIC METABOLIC PANEL: Creatinine: 1.3 mg/dL — AB (ref 0.5–1.1)

## 2012-07-26 LAB — CBC AND DIFFERENTIAL: Hemoglobin: 12.9 g/dL (ref 12.0–16.0)

## 2012-07-26 LAB — HEPATIC FUNCTION PANEL
ALT: 8 U/L (ref 7–35)
AST: 16 U/L (ref 13–35)
Alkaline Phosphatase: 76 U/L (ref 25–125)

## 2012-12-24 ENCOUNTER — Non-Acute Institutional Stay (SKILLED_NURSING_FACILITY): Payer: Medicare Other | Admitting: Nurse Practitioner

## 2012-12-24 DIAGNOSIS — I10 Essential (primary) hypertension: Secondary | ICD-10-CM

## 2012-12-24 DIAGNOSIS — F329 Major depressive disorder, single episode, unspecified: Secondary | ICD-10-CM

## 2012-12-24 DIAGNOSIS — K219 Gastro-esophageal reflux disease without esophagitis: Secondary | ICD-10-CM

## 2012-12-24 DIAGNOSIS — M549 Dorsalgia, unspecified: Secondary | ICD-10-CM

## 2012-12-24 DIAGNOSIS — F068 Other specified mental disorders due to known physiological condition: Secondary | ICD-10-CM

## 2012-12-26 DIAGNOSIS — K219 Gastro-esophageal reflux disease without esophagitis: Secondary | ICD-10-CM

## 2012-12-26 DIAGNOSIS — F329 Major depressive disorder, single episode, unspecified: Secondary | ICD-10-CM

## 2012-12-26 HISTORY — DX: Gastro-esophageal reflux disease without esophagitis: K21.9

## 2012-12-26 HISTORY — DX: Major depressive disorder, single episode, unspecified: F32.9

## 2012-12-26 NOTE — Progress Notes (Signed)
  Subjective:    Patient ID: Veronica Sanchez, female    DOB: Apr 29, 1924, 79 y.o.   MRN: [id number]  HPI    DEPRESSION, ACUTE MAJOR  stabilized with Buspirone.   ALZHEIMER'S  progressing gradually, currently  the patient has been stabilized with her behaviors, on Aricept and Namenda.  HTN UNSPECIFIED The blood pressure readings taken outside the office since the last visit have been in the target range., off Metoprolol   ESOPHAGEAL REFLUX The symptoms are stable.takes Famotidine  SEIZURE  free and off medications.   Lower Back Pain: much better controlled with Tylenol 650mg  tid and Tramadol 50mg  qid w/o apparent adverse reaction noted.    Review of Systems  Constitutional: Negative for fever, chills, diaphoresis, activity change, appetite change and fatigue.  HENT: Positive for hearing loss. Negative for congestion, rhinorrhea, trouble swallowing, neck pain, neck stiffness and postnasal drip.   Eyes: Negative for pain, discharge, redness, itching and visual disturbance.  Respiratory: Negative for cough, choking, chest tightness, shortness of breath and wheezing.   Cardiovascular: Negative for chest pain, palpitations and leg swelling.  Gastrointestinal: Negative for nausea, vomiting, abdominal pain, diarrhea and constipation.  Endocrine: Negative.   Genitourinary: Negative for dysuria, urgency, frequency, flank pain and pelvic pain.  Musculoskeletal: Positive for back pain, arthralgias and gait problem. Negative for myalgias and joint swelling.  Skin: Negative for rash and wound.  Allergic/Immunologic: Negative.   Neurological: Negative for tremors, seizures, syncope, speech difficulty, weakness, numbness and headaches.  Hematological: Negative.   Psychiatric/Behavioral: Positive for confusion (dementia) and agitation (only with shower). Negative for hallucinations, sleep disturbance and dysphoric mood. The patient is not nervous/anxious.         Objective:   Physical Exam   Constitutional: She is oriented to person, place, and time. She appears well-developed and well-nourished. No distress.  HENT:  Head: Normocephalic and atraumatic.  Eyes: Conjunctivae and EOM are normal. Pupils are equal, round, and reactive to light.  Neck: Normal range of motion. Neck supple. No JVD present. No thyromegaly present.  Cardiovascular: Normal rate and regular rhythm.   No murmur heard. Pulmonary/Chest: Effort normal and breath sounds normal. She has no wheezes. She has no rales.  Abdominal: Soft. Bowel sounds are normal. There is no tenderness.  Musculoskeletal: Normal range of motion. She exhibits no edema and no tenderness.  Chronic lower back pain--used to treated with Fentanyl patch--now controlled with Tylenol and Tramadol  Lymphadenopathy:    She has no cervical adenopathy.  Neurological: She is alert and oriented to person, place, and time. She displays normal reflexes. No cranial nerve deficit. She exhibits normal muscle tone. Coordination normal.  Skin: Skin is warm and dry. No rash noted. She is not diaphoretic.  Psychiatric: She has a normal mood and affect. Her behavior is normal. Thought content normal.  Except agitated when assisted with shower--no issue since she is only assisted with sponge bath now          Assessment & Plan:   HYPERLIPIDEMIA Continue Atorvastatin 5mg   . DEMENTIA Stable .  Marland Kitchen HYPERTENSION Controlled w/o medication.   Marland Kitchen BACK PAIN Better controlled with Tylenol and Tramadol in the past 2 weeks--continue  . Major depressive disorder, single episode, unspecified Better with Buspirone  . Esophageal reflux Stable with Famotidine.

## 2013-01-23 ENCOUNTER — Non-Acute Institutional Stay (SKILLED_NURSING_FACILITY): Payer: Medicare Other | Admitting: Nurse Practitioner

## 2013-01-23 DIAGNOSIS — F329 Major depressive disorder, single episode, unspecified: Secondary | ICD-10-CM

## 2013-01-23 DIAGNOSIS — M549 Dorsalgia, unspecified: Secondary | ICD-10-CM

## 2013-01-23 DIAGNOSIS — K219 Gastro-esophageal reflux disease without esophagitis: Secondary | ICD-10-CM

## 2013-01-23 DIAGNOSIS — F068 Other specified mental disorders due to known physiological condition: Secondary | ICD-10-CM

## 2013-01-23 NOTE — Assessment & Plan Note (Signed)
Currently resides in Memory Care Unit, takes Aricept 10mg, Namenda 10mg bid, refuses Labs and bath   

## 2013-01-23 NOTE — Assessment & Plan Note (Signed)
Stable on Famotidine 20mg   

## 2013-01-23 NOTE — Assessment & Plan Note (Signed)
Stabilized on Buspar 10mg tid.    

## 2013-01-23 NOTE — Assessment & Plan Note (Signed)
Managed with Tylenl 650mg  tid, she only walks with walker for a short distance.

## 2013-01-23 NOTE — Progress Notes (Signed)
Patient ID: Veronica Sanchez, female   DOB: 1924/07/29, 79 y.o.   MRN: [id number]  Chief Complaint:  Chief Complaint  Patient presents with  . Medical Managment of Chronic Issues     HPI:   Problem List Items Addressed This Visit     ICD-9-CM   DEMENTIA     Currently resides in Memory Care Unit, takes Aricept 10mg , Namenda 10mg  bid, refuses Labs and bath    BACK PAIN     Managed with Tylenl 650mg  tid, she only walks with walker for a short distance.     Major depressive disorder, single episode, unspecified     Stabilized on Buspar 10mg  tid.     Esophageal reflux - Primary     Stable on Famotidine 20mg         Review of Systems:  Review of Systems  Constitutional: Negative for fever, chills, weight loss, malaise/fatigue and diaphoresis.  HENT: Positive for hearing loss. Negative for ear pain, congestion, sore throat and neck pain.   Eyes: Negative for pain, discharge and redness.  Respiratory: Negative for cough, sputum production and wheezing.   Cardiovascular: Negative for chest pain, palpitations, orthopnea, claudication, leg swelling and PND.  Gastrointestinal: Negative for heartburn, nausea, vomiting, abdominal pain, constipation, blood in stool and melena.  Genitourinary: Positive for frequency. Negative for dysuria, urgency, hematuria and flank pain.  Musculoskeletal: Positive for back pain. Negative for myalgias and joint pain.  Skin: Negative for itching and rash.  Neurological: Negative for dizziness, tingling, tremors, sensory change, speech change, focal weakness, seizures, loss of consciousness, weakness and headaches.  Endo/Heme/Allergies: Negative for environmental allergies and polydipsia. Does not bruise/bleed easily.  Psychiatric/Behavioral: Positive for depression and memory loss. The patient is nervous/anxious. The patient does not have insomnia.      Medications: Reviewed at [facility]   Physical Exam: Physical Exam  Constitutional: She is oriented to  person, place, and time. She appears well-developed and well-nourished. No distress.  HENT:  Head: Normocephalic and atraumatic.  Eyes: Conjunctivae and EOM are normal. Pupils are equal, round, and reactive to light.  Neck: Normal range of motion. Neck supple. No JVD present. No thyromegaly present.  Cardiovascular: Normal rate and regular rhythm.   No murmur heard. Pulmonary/Chest: Effort normal and breath sounds normal. She has no wheezes. She has no rales.  Abdominal: Soft. Bowel sounds are normal. There is no tenderness.  Musculoskeletal: Normal range of motion. She exhibits no edema and no tenderness.  Chronic lower back pain--used to treated with Fentanyl patch--now controlled with Tylenol and Tramadol  Lymphadenopathy:    She has no cervical adenopathy.  Neurological: She is alert and oriented to person, place, and time. She displays normal reflexes. No cranial nerve deficit. She exhibits normal muscle tone. Coordination normal.  Skin: Skin is warm and dry. No rash noted. She is not diaphoretic.  Psychiatric: She has a normal mood and affect. Her behavior is normal. Thought content normal.  Except agitated when assisted with shower--no issue since she is only assisted with sponge bath now     Filed Vitals:   01/23/13 1445  BP: 105/68  Pulse: 84  Temp: 97.1 F (36.2 C)  TempSrc: Tympanic  Resp: 20      Labs reviewed: Basic Metabolic Panel: No results found for this basename: NA, K, CL, CO2, GLUCOSE, BUN, CREATININE, CALCIUM, MG, PHOS, TSH,  in the last 8760 hours  Liver Function Tests: No results found for this basename: AST, ALT, ALKPHOS, BILITOT, PROT, ALBUMIN,  in  the last 8760 hours  CBC: No results found for this basename: WBC, NEUTROABS, HGB, HCT, MCV, PLT,  in the last 8760 hours  Anemia Panel: No results found for this basename: IRON, FOLATE, VITAMINB12,  in the last 8760 hours  Significant Diagnostic Results:     Assessment/Plan Esophageal  reflux Stable on Famotidine 20mg    DEMENTIA Currently resides in Memory Care Unit, takes Aricept 10mg , Namenda 10mg  bid, refuses Labs and bath  Major depressive disorder, single episode, unspecified Stabilized on Buspar 10mg  tid.   BACK PAIN Managed with Tylenl 650mg  tid, she only walks with walker for a short distance.       Family/ staff Communication: safety   Goals of care: Memory Care Unit   Labs/tests ordered none

## 2013-02-13 ENCOUNTER — Non-Acute Institutional Stay (SKILLED_NURSING_FACILITY): Payer: Medicare Other | Admitting: Nurse Practitioner

## 2013-02-13 DIAGNOSIS — K219 Gastro-esophageal reflux disease without esophagitis: Secondary | ICD-10-CM

## 2013-02-13 DIAGNOSIS — E785 Hyperlipidemia, unspecified: Secondary | ICD-10-CM

## 2013-02-13 DIAGNOSIS — F329 Major depressive disorder, single episode, unspecified: Secondary | ICD-10-CM

## 2013-02-13 DIAGNOSIS — F068 Other specified mental disorders due to known physiological condition: Secondary | ICD-10-CM

## 2013-02-13 DIAGNOSIS — I10 Essential (primary) hypertension: Secondary | ICD-10-CM

## 2013-02-13 DIAGNOSIS — J309 Allergic rhinitis, unspecified: Secondary | ICD-10-CM

## 2013-02-13 DIAGNOSIS — M549 Dorsalgia, unspecified: Secondary | ICD-10-CM

## 2013-02-13 DIAGNOSIS — J302 Other seasonal allergic rhinitis: Secondary | ICD-10-CM | POA: Insufficient documentation

## 2013-02-13 NOTE — Assessment & Plan Note (Signed)
Stable on Famotidine 20mg   

## 2013-02-13 NOTE — Assessment & Plan Note (Signed)
Managed with Tylenl 650mg tid and Tramadol 50mg qid routine and prn, she only walks with walker for a short distance.        

## 2013-02-13 NOTE — Assessment & Plan Note (Signed)
Takes Atorvastatin 10mg daily.      

## 2013-02-13 NOTE — Assessment & Plan Note (Signed)
Currently resides in Memory Care Unit, takes Aricept 10mg , Namenda 10mg  bid, refuses Labs and bath

## 2013-02-13 NOTE — Assessment & Plan Note (Signed)
Stabilized on Buspar 10mg tid.    

## 2013-02-13 NOTE — Progress Notes (Signed)
Patient ID: Veronica Sanchez, female   DOB: 01/30/1924, 79 y.o.   MRN: [id number]  Chief Complaint:  Chief Complaint  Patient presents with  . Medical Managment of Chronic Issues     HPI:   Problem List Items Addressed This Visit   BACK PAIN (Chronic)     Managed with Tylenl 650mg  tid and Tramadol 50mg  qid routine and prn, she only walks with walker for a short distance.       HYPERLIPIDEMIA     Takes Atorvastatin 10mg  daily.     DEMENTIA     Currently resides in Memory Care Unit, takes Aricept 10mg , Namenda 10mg  bid, refuses Labs and bath      HYPERTENSION - Primary     Low measurements, not taking antihypertensive agents, asymptomatic.     Major depressive disorder, single episode, unspecified     Stabilized on Buspar 10mg  tid.       Esophageal reflux     Stable on Famotidine 20mg        Seasonal allergies     Symptomatic management with Loratadine 10mg  daily.        Review of Systems:  Review of Systems  Constitutional: Negative for fever, chills, weight loss, malaise/fatigue and diaphoresis.  HENT: Positive for hearing loss. Negative for ear pain, congestion, sore throat and neck pain.   Eyes: Negative for pain, discharge and redness.  Respiratory: Negative for cough, sputum production and wheezing.   Cardiovascular: Negative for chest pain, palpitations, orthopnea, claudication, leg swelling and PND.  Gastrointestinal: Negative for heartburn, nausea, vomiting, abdominal pain, constipation, blood in stool and melena.  Genitourinary: Positive for frequency. Negative for dysuria, urgency, hematuria and flank pain.  Musculoskeletal: Positive for back pain. Negative for myalgias and joint pain.  Skin: Negative for itching and rash.  Neurological: Negative for dizziness, tingling, tremors, sensory change, speech change, focal weakness, seizures, loss of consciousness, weakness and headaches.  Endo/Heme/Allergies: Negative for environmental allergies and polydipsia.  Does not bruise/bleed easily.  Psychiatric/Behavioral: Positive for depression and memory loss. The patient is nervous/anxious. The patient does not have insomnia.      Medications: Reviewed at [facility]   Physical Exam: Physical Exam  Constitutional: She is oriented to person, place, and time. She appears well-developed and well-nourished. No distress.  HENT:  Head: Normocephalic and atraumatic.  Eyes: Conjunctivae and EOM are normal. Pupils are equal, round, and reactive to light.  Neck: Normal range of motion. Neck supple. No JVD present. No thyromegaly present.  Cardiovascular: Normal rate and regular rhythm.   No murmur heard. Pulmonary/Chest: Effort normal and breath sounds normal. She has no wheezes. She has no rales.  Abdominal: Soft. Bowel sounds are normal. There is no tenderness.  Musculoskeletal: Normal range of motion. She exhibits no edema and no tenderness.  Chronic lower back pain--used to treated with Fentanyl patch--now controlled with Tylenol and Tramadol  Lymphadenopathy:    She has no cervical adenopathy.  Neurological: She is alert and oriented to person, place, and time. She displays normal reflexes. No cranial nerve deficit. She exhibits normal muscle tone. Coordination normal.  Skin: Skin is warm and dry. No rash noted. She is not diaphoretic.  Psychiatric: She has a normal mood and affect. Her behavior is normal. Thought content normal.  Except agitated when assisted with shower--no issue since she is only assisted with sponge bath now     Filed Vitals:   02/13/13 1259  BP: 101/56  Pulse: 56  Temp: 96 F (35.6 C)  TempSrc: Tympanic  Resp: 16      Labs reviewed: Basic Metabolic Panel:  Recent Labs  78/29/56  NA 143  K 4.7  BUN 42*  CREATININE 1.3*  TSH 1.48    Liver Function Tests:  Recent Labs  07/26/12  AST 16  ALT 8  ALKPHOS 76    CBC:  Recent Labs  07/26/12  WBC 7.7  HGB 12.9  HCT 38  PLT 171    Anemia Panel: No results  found for this basename: IRON, FOLATE, VITAMINB12,  in the last 8760 hours  Significant Diagnostic Results:     Assessment/Plan HYPERTENSION Low measurements, not taking antihypertensive agents, asymptomatic.   Esophageal reflux Stable on Famotidine 20mg      Seasonal allergies Symptomatic management with Loratadine 10mg  daily.   DEMENTIA Currently resides in Memory Care Unit, takes Aricept 10mg , Namenda 10mg  bid, refuses Labs and bath    Major depressive disorder, single episode, unspecified Stabilized on Buspar 10mg  tid.     BACK PAIN Managed with Tylenl 650mg  tid and Tramadol 50mg  qid routine and prn, she only walks with walker for a short distance.     HYPERLIPIDEMIA Takes Atorvastatin 10mg  daily.       Family/ staff Communication: none   Goals of care: SNF Memory Care Unit   Labs/tests ordered none    2

## 2013-02-13 NOTE — Assessment & Plan Note (Signed)
Symptomatic management with Loratadine 10mg daily.              

## 2013-02-13 NOTE — Assessment & Plan Note (Signed)
Low measurements, not taking antihypertensive agents, asymptomatic.      

## 2013-03-06 ENCOUNTER — Non-Acute Institutional Stay (SKILLED_NURSING_FACILITY): Payer: Medicare Other | Admitting: Nurse Practitioner

## 2013-03-06 DIAGNOSIS — K5900 Constipation, unspecified: Secondary | ICD-10-CM

## 2013-03-06 DIAGNOSIS — Z66 Do not resuscitate: Secondary | ICD-10-CM

## 2013-03-06 DIAGNOSIS — F068 Other specified mental disorders due to known physiological condition: Secondary | ICD-10-CM

## 2013-03-06 DIAGNOSIS — E785 Hyperlipidemia, unspecified: Secondary | ICD-10-CM

## 2013-03-06 DIAGNOSIS — F329 Major depressive disorder, single episode, unspecified: Secondary | ICD-10-CM

## 2013-03-06 DIAGNOSIS — J309 Allergic rhinitis, unspecified: Secondary | ICD-10-CM

## 2013-03-06 DIAGNOSIS — J302 Other seasonal allergic rhinitis: Secondary | ICD-10-CM

## 2013-03-06 DIAGNOSIS — K219 Gastro-esophageal reflux disease without esophagitis: Secondary | ICD-10-CM

## 2013-03-06 DIAGNOSIS — M549 Dorsalgia, unspecified: Secondary | ICD-10-CM

## 2013-03-06 HISTORY — DX: Constipation, unspecified: K59.00

## 2013-03-06 NOTE — Assessment & Plan Note (Signed)
Currently resides in Memory Care Unit, takes Aricept 10mg , Namenda 28mg , refuses Labs and bath

## 2013-03-06 NOTE — Assessment & Plan Note (Signed)
Stable on MiraLax.   

## 2013-03-06 NOTE — Progress Notes (Signed)
Patient ID: Veronica Sanchez, female   DOB: Dec 18, 1923, 79 y.o.   MRN: [id number]  Chief Complaint:  Chief Complaint  Patient presents with  . Medical Managment of Chronic Issues     HPI:    Problem List Items Addressed This Visit   BACK PAIN (Chronic)     Managed with Tylenl 650mg  tid and Tramadol 50mg  qid routine and prn, she only walks with walker for a short distance.         HYPERLIPIDEMIA     Takes Atorvastatin 10mg  daily.       DEMENTIA     Currently resides in Memory Care Unit, takes Aricept 10mg , Namenda 28mg , refuses Labs and bath        Major depressive disorder, single episode, unspecified     Stabilized on Buspar 10mg  tid.         Esophageal reflux     Stable on Famotidine 20mg          Seasonal allergies     Symptomatic management with Loratadine 10mg  daily.       DNR (do not resuscitate) - Primary   Unspecified constipation     Stable on MiraLax.        Review of Systems:  Review of Systems  Constitutional: Negative for fever, chills, weight loss, malaise/fatigue and diaphoresis.  HENT: Positive for hearing loss. Negative for ear pain, congestion, sore throat and neck pain.   Eyes: Negative for pain, discharge and redness.  Respiratory: Negative for cough, sputum production and wheezing.   Cardiovascular: Negative for chest pain, palpitations, orthopnea, claudication, leg swelling and PND.  Gastrointestinal: Negative for heartburn, nausea, vomiting, abdominal pain, constipation, blood in stool and melena.  Genitourinary: Positive for frequency. Negative for dysuria, urgency, hematuria and flank pain.  Musculoskeletal: Positive for back pain. Negative for myalgias and joint pain.  Skin: Negative for itching and rash.  Neurological: Negative for dizziness, tingling, tremors, sensory change, speech change, focal weakness, seizures, loss of consciousness, weakness and headaches.  Endo/Heme/Allergies: Negative for environmental allergies and  polydipsia. Does not bruise/bleed easily.  Psychiatric/Behavioral: Positive for depression and memory loss. The patient is nervous/anxious. The patient does not have insomnia.      Medications: Reviewed at [facility]   Physical Exam: Physical Exam  Constitutional: She is oriented to person, place, and time. She appears well-developed and well-nourished. No distress.  HENT:  Head: Normocephalic and atraumatic.  Eyes: Conjunctivae and EOM are normal. Pupils are equal, round, and reactive to light.  Neck: Normal range of motion. Neck supple. No JVD present. No thyromegaly present.  Cardiovascular: Normal rate and regular rhythm.   No murmur heard. Pulmonary/Chest: Effort normal and breath sounds normal. She has no wheezes. She has no rales.  Abdominal: Soft. Bowel sounds are normal. There is no tenderness.  Musculoskeletal: Normal range of motion. She exhibits no edema and no tenderness.  Chronic lower back pain--used to treated with Fentanyl patch--now controlled with Tylenol and Tramadol  Lymphadenopathy:    She has no cervical adenopathy.  Neurological: She is alert and oriented to person, place, and time. She displays normal reflexes. No cranial nerve deficit. She exhibits normal muscle tone. Coordination normal.  Skin: Skin is warm and dry. No rash noted. She is not diaphoretic.  Psychiatric: She has a normal mood and affect. Her behavior is normal. Thought content normal.  Except agitated when assisted with shower--no issue since she is only assisted with sponge bath now     Filed Vitals:  03/06/13 1457  BP: 118/70  Pulse: 76  Temp: 97.2 F (36.2 C)  TempSrc: Tympanic  Resp: 20      Labs reviewed: Basic Metabolic Panel:  Recent Labs  98/11/91  NA 143  K 4.7  BUN 42*  CREATININE 1.3*  TSH 1.48    Liver Function Tests:  Recent Labs  07/26/12  AST 16  ALT 8  ALKPHOS 76    CBC:  Recent Labs  07/26/12  WBC 7.7  HGB 12.9  HCT 38  PLT 171    Anemia  Panel: No results found for this basename: IRON, FOLATE, VITAMINB12,  in the last 8760 hours  Significant Diagnostic Results:     Assessment/Plan Esophageal reflux Stable on Famotidine 20mg        Seasonal allergies Symptomatic management with Loratadine 10mg  daily.     DEMENTIA Currently resides in Memory Care Unit, takes Aricept 10mg , Namenda 28mg , refuses Labs and bath      Unspecified constipation Stable on MiraLax.   Major depressive disorder, single episode, unspecified Stabilized on Buspar 10mg  tid.       BACK PAIN Managed with Tylenl 650mg  tid and Tramadol 50mg  qid routine and prn, she only walks with walker for a short distance.       HYPERLIPIDEMIA Takes Atorvastatin 10mg  daily.         Family/ staff Communication: observe the patient.    Goals of care: SNF and adjusting well since moved out of Memory Care Unit.   Labs/tests ordered none.

## 2013-03-06 NOTE — Assessment & Plan Note (Signed)
Symptomatic management with Loratadine 10mg daily.              

## 2013-03-06 NOTE — Assessment & Plan Note (Signed)
Takes Atorvastatin 10mg  daily.

## 2013-03-06 NOTE — Assessment & Plan Note (Signed)
Managed with Tylenl 650mg  tid and Tramadol 50mg  qid routine and prn, she only walks with walker for a short distance.

## 2013-03-06 NOTE — Assessment & Plan Note (Signed)
Stable on Famotidine 20mg   

## 2013-03-06 NOTE — Assessment & Plan Note (Signed)
Stabilized on Buspar 10mg tid.    

## 2013-03-25 ENCOUNTER — Non-Acute Institutional Stay (SKILLED_NURSING_FACILITY): Payer: Medicare Other | Admitting: Nurse Practitioner

## 2013-03-25 DIAGNOSIS — M549 Dorsalgia, unspecified: Secondary | ICD-10-CM

## 2013-03-25 DIAGNOSIS — N390 Urinary tract infection, site not specified: Secondary | ICD-10-CM

## 2013-03-25 DIAGNOSIS — F068 Other specified mental disorders due to known physiological condition: Secondary | ICD-10-CM

## 2013-03-25 NOTE — Assessment & Plan Note (Signed)
Currently resides in Memory Care Unit, takes Aricept 10mg , Namenda 28mg , refuses Labs and bath

## 2013-03-25 NOTE — Assessment & Plan Note (Signed)
Managed with Tylenl 650mg  tid and Tramadol 50mg  qid routine and prn, she only walks with walker for a short distance.

## 2013-03-25 NOTE — Assessment & Plan Note (Signed)
Urine culture 03/14/13 showed E-Coli--Cipro 250mg  bid completed

## 2013-03-25 NOTE — Progress Notes (Signed)
Patient ID: Veronica Sanchez, female   DOB: 12/12/1923, 79 y.o.   MRN: [id number]  Chief Complaint:  Chief Complaint  Patient presents with  . Medical Managment of Chronic Issues    UTI     HPI:   Problem List Items Addressed This Visit   BACK PAIN (Chronic)     Managed with Tylenl 650mg  tid and Tramadol 50mg  qid routine and prn, she only walks with walker for a short distance.           DEMENTIA     Currently resides in Memory Care Unit, takes Aricept 10mg , Namenda 28mg , refuses Labs and bath          UTI (urinary tract infection) - Primary (Chronic)     Urine culture 03/14/13 showed E-Coli--Cipro 250mg  bid completed        Review of Systems:  Review of Systems  Constitutional: Negative for fever, chills, weight loss, malaise/fatigue and diaphoresis.  HENT: Positive for hearing loss. Negative for ear pain, congestion, sore throat and neck pain.   Eyes: Negative for pain, discharge and redness.  Respiratory: Negative for cough, sputum production and wheezing.   Cardiovascular: Negative for chest pain, palpitations, orthopnea, claudication, leg swelling and PND.  Gastrointestinal: Negative for heartburn, nausea, vomiting, abdominal pain, constipation, blood in stool and melena.  Genitourinary: Positive for frequency. Negative for dysuria, urgency, hematuria and flank pain.  Musculoskeletal: Positive for back pain. Negative for myalgias and joint pain.  Skin: Negative for itching and rash.  Neurological: Negative for dizziness, tingling, tremors, sensory change, speech change, focal weakness, seizures, loss of consciousness, weakness and headaches.  Endo/Heme/Allergies: Negative for environmental allergies and polydipsia. Does not bruise/bleed easily.  Psychiatric/Behavioral: Positive for depression and memory loss. The patient is nervous/anxious. The patient does not have insomnia.      Medications: Patient's Medications   No medications on file     Physical  Exam: Physical Exam  Constitutional: She is oriented to person, place, and time. She appears well-developed and well-nourished. No distress.  HENT:  Head: Normocephalic and atraumatic.  Eyes: Conjunctivae and EOM are normal. Pupils are equal, round, and reactive to light.  Neck: Normal range of motion. Neck supple. No JVD present. No thyromegaly present.  Cardiovascular: Normal rate and regular rhythm.   No murmur heard. Pulmonary/Chest: Effort normal and breath sounds normal. She has no wheezes. She has no rales.  Abdominal: Soft. Bowel sounds are normal. There is no tenderness.  Musculoskeletal: Normal range of motion. She exhibits no edema and no tenderness.  Chronic lower back pain--used to treated with Fentanyl patch--now controlled with Tylenol and Tramadol  Lymphadenopathy:    She has no cervical adenopathy.  Neurological: She is alert and oriented to person, place, and time. She displays normal reflexes. No cranial nerve deficit. She exhibits normal muscle tone. Coordination normal.  Skin: Skin is warm and dry. No rash noted. She is not diaphoretic.  Psychiatric: She has a normal mood and affect. Her behavior is normal. Thought content normal.  Except agitated when assisted with shower--no issue since she is only assisted with sponge bath now     Filed Vitals:   03/25/13 1350  BP: 134/88  Pulse: 76  Temp: 97.9 F (36.6 C)  TempSrc: Tympanic  Resp: 20      Labs reviewed: Basic Metabolic Panel:  Recent Labs  14/78/29  NA 143  K 4.7  BUN 42*  CREATININE 1.3*  TSH 1.48    Liver Function Tests:  Recent  Labs  07/26/12  AST 16  ALT 8  ALKPHOS 76    CBC:  Recent Labs  07/26/12  WBC 7.7  HGB 12.9  HCT 38  PLT 171    Anemia Panel: No results found for this basename: IRON, FOLATE, VITAMINB12,  in the last 8760 hours  Significant Diagnostic Results:     Assessment/Plan UTI (urinary tract infection) Urine culture 03/14/13 showed E-Coli--Cipro 250mg   bid completed   BACK PAIN Managed with Tylenl 650mg  tid and Tramadol 50mg  qid routine and prn, she only walks with walker for a short distance.         DEMENTIA Currently resides in Memory Care Unit, takes Aricept 10mg , Namenda 28mg , refuses Labs and bath            Family/ staff Communication: observe the patient.    Goals of care: SNF   Labs/tests ordered none

## 2013-04-12 ENCOUNTER — Encounter: Payer: Self-pay | Admitting: *Deleted

## 2013-04-15 ENCOUNTER — Non-Acute Institutional Stay (SKILLED_NURSING_FACILITY): Payer: Medicare Other | Admitting: Nurse Practitioner

## 2013-04-15 DIAGNOSIS — K219 Gastro-esophageal reflux disease without esophagitis: Secondary | ICD-10-CM

## 2013-04-15 DIAGNOSIS — K5900 Constipation, unspecified: Secondary | ICD-10-CM

## 2013-04-15 DIAGNOSIS — F068 Other specified mental disorders due to known physiological condition: Secondary | ICD-10-CM

## 2013-04-15 DIAGNOSIS — F329 Major depressive disorder, single episode, unspecified: Secondary | ICD-10-CM

## 2013-04-15 DIAGNOSIS — M549 Dorsalgia, unspecified: Secondary | ICD-10-CM

## 2013-04-15 NOTE — Progress Notes (Signed)
Patient ID: Veronica Sanchez, female   DOB: 03-Feb-1924, 79 y.o.   MRN: [id number] Code Status: DNR  Allergies  Allergen Reactions  . Amoxicillin     REACTION: unspecified  . Celebrex (Celecoxib)   . Codeine Sulfate     REACTION: unspecified  . Naproxen Sodium     REACTION: unspecified  . Reminyl (Galantamine Hydrobromide)   . Versed (Midazolam)   . Vioxx (Rofecoxib)     Chief Complaint  Patient presents with  . Medical Managment of Chronic Issues    HPI: Patient is a 79 y.o. female seen in the SNF at [facility] today for evaluation of her chronic medical conditions.  Problem List Items Addressed This Visit   BACK PAIN (Chronic)     Managed with Tylenl 650mg  tid and Tramadol 50mg  qid routine and prn, she only walks with walker for a short distance.             DEMENTIA     Currently resides in Memory Care Unit, takes Aricept 10mg , Namenda 28mg , refuses Labs and bath          Esophageal reflux - Primary     Stable on Famotidine 20mg            Major depressive disorder, single episode, unspecified     Stabilized on Buspar 10mg  tid.         Unspecified constipation     Stable on MiraLax.          Review of Systems:  Review of Systems  Constitutional: Negative for fever, chills, weight loss, malaise/fatigue and diaphoresis.  HENT: Positive for hearing loss. Negative for ear pain, congestion, sore throat and neck pain.   Eyes: Negative for pain, discharge and redness.  Respiratory: Negative for cough, sputum production and wheezing.   Cardiovascular: Negative for chest pain, palpitations, orthopnea, claudication, leg swelling and PND.  Gastrointestinal: Negative for heartburn, nausea, vomiting, abdominal pain, constipation, blood in stool and melena.  Genitourinary: Positive for frequency. Negative for dysuria, urgency, hematuria and flank pain.  Musculoskeletal: Positive for back pain. Negative for myalgias and joint pain.  Skin: Negative  for itching and rash.  Neurological: Negative for dizziness, tingling, tremors, sensory change, speech change, focal weakness, seizures, loss of consciousness, weakness and headaches.  Endo/Heme/Allergies: Negative for environmental allergies and polydipsia. Does not bruise/bleed easily.  Psychiatric/Behavioral: Positive for depression and memory loss. The patient is nervous/anxious. The patient does not have insomnia.       Social History:   reports that she has quit smoking. Her smoking use included Cigarettes. She smoked 0.00 packs per day for 30 years. She does not have any smokeless tobacco history on file. She reports that she does not drink alcohol or use illicit drugs.  Family History  Problem Relation Age of Onset  . Stroke Mother   . Dementia Mother   . Alcohol abuse Father   . Arthritis Sister   . Arthritis Sister     Medications: Reviewed at [facility]   Physical Exam: Physical Exam  Constitutional: She is oriented to person, place, and time. She appears well-developed and well-nourished. No distress.  HENT:  Head: Normocephalic and atraumatic.  Eyes: Conjunctivae and EOM are normal. Pupils are equal, round, and reactive to light.  Neck: Normal range of motion. Neck supple. No JVD present. No thyromegaly present.  Cardiovascular: Normal rate and regular rhythm.   No murmur heard. Pulmonary/Chest: Effort normal and breath sounds normal. She has no wheezes. She has no  rales.  Abdominal: Soft. Bowel sounds are normal. There is no tenderness.  Musculoskeletal: Normal range of motion. She exhibits no edema and no tenderness.  Chronic lower back pain--used to treated with Fentanyl patch--now controlled with Tylenol and Tramadol  Lymphadenopathy:    She has no cervical adenopathy.  Neurological: She is alert and oriented to person, place, and time. She displays normal reflexes. No cranial nerve deficit. She exhibits normal muscle tone. Coordination normal.  Skin: Skin is warm  and dry. No rash noted. She is not diaphoretic.  Psychiatric: She has a normal mood and affect. Her behavior is normal. Thought content normal.  Except agitated when assisted with shower--no issue since she is only assisted with sponge bath now    Filed Vitals:   04/17/13 1213  BP: 132/64  Pulse: 56  Temp: 98.7 F (37.1 C)  TempSrc: Tympanic  Resp: 18      Labs reviewed: Basic Metabolic Panel:  Recent Labs  40/98/11  NA 143  K 4.7  BUN 42*  CREATININE 1.3*  TSH 1.48   Liver Function Tests:  Recent Labs  07/26/12  AST 16  ALT 8  ALKPHOS 76    CBC:  Recent Labs  07/26/12  WBC 7.7  HGB 12.9  HCT 38  PLT 171        Assessment/Plan Esophageal reflux Stable on Famotidine 20mg          DEMENTIA Currently resides in Memory Care Unit, takes Aricept 10mg , Namenda 28mg , refuses Labs and bath        Unspecified constipation Stable on MiraLax.     Major depressive disorder, single episode, unspecified Stabilized on Buspar 10mg  tid.       BACK PAIN Managed with Tylenl 650mg  tid and Tramadol 50mg  qid routine and prn, she only walks with walker for a short distance.             Family/ Staff Communication: observe the patient.   Goals of Care: SNF  Labs/tests ordered: none

## 2013-04-17 ENCOUNTER — Encounter: Payer: Self-pay | Admitting: Nurse Practitioner

## 2013-04-17 NOTE — Assessment & Plan Note (Signed)
Managed with Tylenl 650mg tid and Tramadol 50mg qid routine and prn, she only walks with walker for a short distance.        

## 2013-04-17 NOTE — Assessment & Plan Note (Signed)
Currently resides in Memory Care Unit, takes Aricept 10mg, Namenda 28mg, refuses Labs and bath     

## 2013-04-17 NOTE — Assessment & Plan Note (Signed)
Stable on Famotidine 20mg   

## 2013-04-17 NOTE — Assessment & Plan Note (Signed)
Stable on MiraLax.   

## 2013-04-17 NOTE — Assessment & Plan Note (Signed)
Stabilized on Buspar 10mg tid.    

## 2013-05-15 ENCOUNTER — Encounter: Payer: Self-pay | Admitting: Nurse Practitioner

## 2013-05-15 ENCOUNTER — Non-Acute Institutional Stay (SKILLED_NURSING_FACILITY): Payer: Medicare Other | Admitting: Nurse Practitioner

## 2013-05-15 DIAGNOSIS — M549 Dorsalgia, unspecified: Secondary | ICD-10-CM

## 2013-05-15 DIAGNOSIS — K219 Gastro-esophageal reflux disease without esophagitis: Secondary | ICD-10-CM

## 2013-05-15 DIAGNOSIS — F329 Major depressive disorder, single episode, unspecified: Secondary | ICD-10-CM

## 2013-05-15 DIAGNOSIS — F068 Other specified mental disorders due to known physiological condition: Secondary | ICD-10-CM

## 2013-05-15 DIAGNOSIS — E785 Hyperlipidemia, unspecified: Secondary | ICD-10-CM

## 2013-05-15 DIAGNOSIS — J302 Other seasonal allergic rhinitis: Secondary | ICD-10-CM

## 2013-05-15 DIAGNOSIS — K5900 Constipation, unspecified: Secondary | ICD-10-CM

## 2013-05-15 DIAGNOSIS — J309 Allergic rhinitis, unspecified: Secondary | ICD-10-CM

## 2013-05-15 NOTE — Assessment & Plan Note (Signed)
Managed with Tylenl 650mg  tid and Tramadol 50mg  tid routine and prn, she only walks with walker for a short distance.

## 2013-05-15 NOTE — Assessment & Plan Note (Signed)
Stable on Famotidine 20mg   

## 2013-05-15 NOTE — Assessment & Plan Note (Signed)
Stable on MiraLax.   

## 2013-05-15 NOTE — Assessment & Plan Note (Signed)
Takes Atorvastatin 10mg  daily.

## 2013-05-15 NOTE — Assessment & Plan Note (Signed)
Stabilized on Buspar 10mg tid.    

## 2013-05-15 NOTE — Progress Notes (Signed)
Patient ID: Veronica Sanchez, female   DOB: 27-Aug-1924, 79 y.o.   MRN: [id number] Code Status: DNR  Allergies  Allergen Reactions  . Amoxicillin     REACTION: unspecified  . Celebrex [Celecoxib]   . Codeine Sulfate     REACTION: unspecified  . Naproxen Sodium     REACTION: unspecified  . Reminyl [Galantamine Hydrobromide]   . Versed [Midazolam]   . Vioxx [Rofecoxib]     Chief Complaint  Patient presents with  . Medical Managment of Chronic Issues    HPI: Patient is a 79 y.o. female seen in the SNF at [facility] today for evaluation of  err chronic medical conditions.  Problem List Items Addressed This Visit   BACK PAIN - Primary (Chronic)     Managed with Tylenl 650mg  tid and Tramadol 50mg  tid routine and prn, she only walks with walker for a short distance.               Relevant Medications      acetaminophen (TYLENOL) 650 MG suppository      traMADol (ULTRAM) 50 MG tablet   DEMENTIA     Currently resides in Memory Care Unit, takes Aricept 10mg , Namenda 28mg , refuses Labs and bath            Esophageal reflux     Stable on Famotidine 20mg            Relevant Medications      polyethylene glycol (MIRALAX / GLYCOLAX) packet   HYPERLIPIDEMIA     Takes Atorvastatin 10mg  daily.         Major depressive disorder, single episode, unspecified     Stabilized on Buspar 10mg  tid.           Relevant Medications      busPIRone (BUSPAR) 10 MG tablet   Seasonal allergies     Symptomatic management with Loratadine 10mg  daily.         Unspecified constipation     Stable on MiraLax.            Review of Systems:  Review of Systems  Constitutional: Negative for fever, chills, weight loss, malaise/fatigue and diaphoresis.  HENT: Positive for hearing loss. Negative for ear pain, congestion, sore throat and neck pain.   Eyes: Negative for pain, discharge and redness.  Respiratory: Negative for cough, sputum production and  wheezing.   Cardiovascular: Negative for chest pain, palpitations, orthopnea, claudication, leg swelling and PND.  Gastrointestinal: Negative for heartburn, nausea, vomiting, abdominal pain, constipation, blood in stool and melena.  Genitourinary: Positive for frequency. Negative for dysuria, urgency, hematuria and flank pain.  Musculoskeletal: Positive for back pain. Negative for myalgias and joint pain.  Skin: Negative for itching and rash.  Neurological: Negative for dizziness, tingling, tremors, sensory change, speech change, focal weakness, seizures, loss of consciousness, weakness and headaches.  Endo/Heme/Allergies: Negative for environmental allergies and polydipsia. Does not bruise/bleed easily.  Psychiatric/Behavioral: Positive for depression and memory loss. The patient is nervous/anxious. The patient does not have insomnia.      Past Medical History  Diagnosis Date  . Depression    No past surgical history on file. Social History:   reports that she has quit smoking. Her smoking use included Cigarettes. She smoked 0.00 packs per day for 30 years. She does not have any smokeless tobacco history on file. She reports that she does not drink alcohol or use illicit drugs.  Family History  Problem Relation Age of Onset  .  Stroke Mother   . Dementia Mother   . Alcohol abuse Father   . Arthritis Sister   . Arthritis Sister     Medications: Patient's Medications  New Prescriptions   No medications on file  Previous Medications   ACETAMINOPHEN (TYLENOL) 650 MG SUPPOSITORY    Place 650 mg rectally 3 (three) times daily.   BUSPIRONE (BUSPAR) 10 MG TABLET    Take 10 mg by mouth 3 (three) times daily.   CALCIUM CARBONATE (OS-CAL) 600 MG TABS TABLET    Take 600 mg by mouth 2 (two) times daily with a meal.   DONEPEZIL (ARICEPT) 10 MG TABLET    Take 10 mg by mouth at bedtime as needed. Take 1 tablet once daily to help preserve memory.   FAMOTIDINE (PEPCID) 20 MG TABLET    Take 20 mg  by mouth daily. Take 1 tablet daily.   LORATADINE (CLARITIN) 10 MG TABLET    Take 10 mg by mouth daily. Take 1 tablet once daily for itching skin.   MEMANTINE (NAMENDA) 10 MG TABLET    Take 24 mg by mouth daily. Take 1 tablet twice daily to help preserve memory.   POLYETHYLENE GLYCOL (MIRALAX / GLYCOLAX) PACKET    Take 17 g by mouth daily.   TRAMADOL (ULTRAM) 50 MG TABLET    Take 50 mg by mouth every 6 (six) hours as needed for pain. And tid  Modified Medications   No medications on file  Discontinued Medications   ACETAMINOPHEN (TYLENOL) 500 MG TABLET    Take 500 mg by mouth every 6 (six) hours as needed for pain. Take 2 tablets up to 3 times daily.   AMLODIPINE (NORVASC) 10 MG TABLET    Take 10 mg by mouth daily. Take 1 tablet daily for blood pressure control.   ATORVASTATIN (LIPITOR) 10 MG TABLET    Take 10 mg by mouth daily. Take 1/2 tablet daily.   CALCIUM CARBONATE-VITAMIN D (CALTRATE 600+D) 600-400 MG-UNIT PER TABLET    Take 1 tablet by mouth daily. Take 1 tablet twice daily for bones   DIVALPROEX (DEPAKOTE ER) 250 MG 24 HR TABLET    Take 250 mg by mouth 3 (three) times daily. Take 1 tablet three times daily.   FENTANYL (DURAGESIC - DOSED MCG/HR) 12 MCG/HR    Place 1 patch onto the skin every 3 (three) days. Apply a patch every third day for pain relief.   FOLIC ACID (FOLVITE) 1 MG TABLET    Take 1 mg by mouth 2 (two) times daily. Take 1 tablet daily for folic acid supplement.   OLMESARTAN (BENICAR) 20 MG TABLET    Take 20 mg by mouth daily. Take 1 tablet daily for blood pressure.   OMEPRAZOLE (PRILOSEC) 20 MG CAPSULE    Take 20 mg by mouth daily. Take 1 tablet once daily to reduce stomach acid and protect esophagus.   PHENOBARBITAL (LUMINAL) 32.4 MG TABLET    Take 32.4 mg by mouth at bedtime. Take 1 tablet once at bedtime for pain and rest.   PHENYTOIN (DILANTIN) 100 MG ER CAPSULE    Take 100 mg by mouth 2 (two) times daily. Take 2 tablet on Tuesday, Thursday, Saturday, and Sunday. Take 3  tablets on Monday, Wednesday and Friday for seizures.     Physical Exam: Physical Exam  Constitutional: She is oriented to person, place, and time. She appears well-developed and well-nourished. No distress.  HENT:  Head: Normocephalic and atraumatic.  Eyes: Conjunctivae and EOM are  normal. Pupils are equal, round, and reactive to light.  Neck: Normal range of motion. Neck supple. No JVD present. No thyromegaly present.  Cardiovascular: Normal rate and regular rhythm.   No murmur heard. Pulmonary/Chest: Effort normal and breath sounds normal. She has no wheezes. She has no rales.  Abdominal: Soft. Bowel sounds are normal. There is no tenderness.  Musculoskeletal: Normal range of motion. She exhibits no edema and no tenderness.  Chronic lower back pain--used to treated with Fentanyl patch--now controlled with Tylenol and Tramadol  Lymphadenopathy:    She has no cervical adenopathy.  Neurological: She is alert and oriented to person, place, and time. She displays normal reflexes. No cranial nerve deficit. She exhibits normal muscle tone. Coordination normal.  Skin: Skin is warm and dry. No rash noted. She is not diaphoretic.  Psychiatric: She has a normal mood and affect. Her behavior is normal. Thought content normal.  Except agitated when assisted with shower--no issue since she is only assisted with sponge bath now    Filed Vitals:   05/15/13 1531  BP: 132/64  Pulse: 56  Temp: 98.7 F (37.1 C)  TempSrc: Tympanic  Resp: 18      Labs reviewed: Basic Metabolic Panel:  Recent Labs  40/98/11  NA 143  K 4.7  BUN 42*  CREATININE 1.3*  TSH 1.48   Liver Function Tests:  Recent Labs  07/26/12  AST 16  ALT 8  ALKPHOS 76   CBC:  Recent Labs  07/26/12  WBC 7.7  HGB 12.9  HCT 38  PLT 171   Assessment/Plan BACK PAIN Managed with Tylenl 650mg  tid and Tramadol 50mg  tid routine and prn, she only walks with walker for a short distance.              HYPERLIPIDEMIA Takes Atorvastatin 10mg  daily.       DEMENTIA Currently resides in Memory Care Unit, takes Aricept 10mg , Namenda 28mg , refuses Labs and bath          Major depressive disorder, single episode, unspecified Stabilized on Buspar 10mg  tid.         Esophageal reflux Stable on Famotidine 20mg          Seasonal allergies Symptomatic management with Loratadine 10mg  daily.       Unspecified constipation Stable on MiraLax.         Family/ Staff Communication: observe the patient  Goals of Care: SNF  Labs/tests ordered: none

## 2013-05-15 NOTE — Assessment & Plan Note (Signed)
Symptomatic management with Loratadine 10mg daily.              

## 2013-05-15 NOTE — Assessment & Plan Note (Signed)
Currently resides in Memory Care Unit, takes Aricept 10mg, Namenda 28mg, refuses Labs and bath     

## 2013-06-17 ENCOUNTER — Non-Acute Institutional Stay (SKILLED_NURSING_FACILITY): Payer: Medicare Other | Admitting: Nurse Practitioner

## 2013-06-17 ENCOUNTER — Encounter: Payer: Self-pay | Admitting: Nurse Practitioner

## 2013-06-17 DIAGNOSIS — S8011XA Contusion of right lower leg, initial encounter: Secondary | ICD-10-CM

## 2013-06-17 DIAGNOSIS — K5900 Constipation, unspecified: Secondary | ICD-10-CM

## 2013-06-17 DIAGNOSIS — S9032XA Contusion of left foot, initial encounter: Secondary | ICD-10-CM | POA: Insufficient documentation

## 2013-06-17 DIAGNOSIS — S8010XA Contusion of unspecified lower leg, initial encounter: Secondary | ICD-10-CM

## 2013-06-17 DIAGNOSIS — F329 Major depressive disorder, single episode, unspecified: Secondary | ICD-10-CM

## 2013-06-17 DIAGNOSIS — F068 Other specified mental disorders due to known physiological condition: Secondary | ICD-10-CM

## 2013-06-17 DIAGNOSIS — M549 Dorsalgia, unspecified: Secondary | ICD-10-CM

## 2013-06-17 DIAGNOSIS — K219 Gastro-esophageal reflux disease without esophagitis: Secondary | ICD-10-CM

## 2013-06-17 NOTE — Assessment & Plan Note (Signed)
Stabilized on Buspar 10mg tid.    

## 2013-06-17 NOTE — Assessment & Plan Note (Signed)
Stable on Famotidine 20mg   

## 2013-06-17 NOTE — Assessment & Plan Note (Signed)
The upper medial aspect of the RLE-about 8cmx6cm--tender to touch, slightly warm, able to bear weight, neurovascular intact below the hematoma.

## 2013-06-17 NOTE — Progress Notes (Signed)
Patient ID: Veronica Sanchez, female   DOB: 05/16/1924, 79 y.o.   MRN: [id number] Code Status: DNR  Allergies  Allergen Reactions  . Amoxicillin     REACTION: unspecified  . Celebrex [Celecoxib]   . Codeine Sulfate     REACTION: unspecified  . Naproxen Sodium     REACTION: unspecified  . Reminyl [Galantamine Hydrobromide]   . Versed [Midazolam]   . Vioxx [Rofecoxib]     Chief Complaint  Patient presents with  . Medical Managment of Chronic Issues    the medial right upper lower leg hematoma.     HPI: Patient is a 79 y.o. female seen in the SNF at [facility] today for evaluation of  The right lower leg contusion, and her other chronic medical conditions.  Problem List Items Addressed This Visit   BACK PAIN (Chronic)     Managed with Tylenl 650mg  tid and Tramadol 50mg  tid routine and prn, she only walks with walker for a short distance.                 Contusion of leg, right     The upper medial aspect of the RLE-about 8cmx6cm--tender to touch, slightly warm, able to bear weight, neurovascular intact below the hematoma.     DEMENTIA - Primary     Currently resides in Memory Care Unit, takes Aricept and Namenda, refuses Labs and bath              Esophageal reflux     Stable on Famotidine 20mg              Major depressive disorder, single episode, unspecified     Stabilized on Buspar 10mg  tid.             Unspecified constipation     Stable on MiraLax.              Review of Systems:  Review of Systems  Constitutional: Negative for fever, chills, weight loss, malaise/fatigue and diaphoresis.  HENT: Positive for hearing loss. Negative for ear pain, congestion, sore throat and neck pain.   Eyes: Negative for pain, discharge and redness.  Respiratory: Negative for cough, sputum production and wheezing.   Cardiovascular: Negative for chest pain, palpitations, orthopnea, claudication, leg swelling and PND.   Gastrointestinal: Negative for heartburn, nausea, vomiting, abdominal pain, constipation, blood in stool and melena.  Genitourinary: Positive for frequency. Negative for dysuria, urgency, hematuria and flank pain.  Musculoskeletal: Positive for back pain. Negative for myalgias and joint pain.  Skin: Negative for itching and rash.       The medial right upper RLE contusion-hematoma about 8cmx6cm, mild erythema, tender  Neurological: Negative for dizziness, tingling, tremors, sensory change, speech change, focal weakness, seizures, loss of consciousness, weakness and headaches.  Endo/Heme/Allergies: Negative for environmental allergies and polydipsia. Does not bruise/bleed easily.  Psychiatric/Behavioral: Positive for depression and memory loss. The patient is nervous/anxious. The patient does not have insomnia.      Past Medical History  Diagnosis Date  . Depression    Social History:   reports that she has quit smoking. Her smoking use included Cigarettes. She smoked 0.00 packs per day for 30 years. She does not have any smokeless tobacco history on file. She reports that she does not drink alcohol or use illicit drugs.  Family History  Problem Relation Age of Onset  . Stroke Mother   . Dementia Mother   . Alcohol abuse Father   . Arthritis Sister   .  Arthritis Sister     Medications: Patient's Medications  New Prescriptions   No medications on file  Previous Medications   ACETAMINOPHEN (TYLENOL) 650 MG SUPPOSITORY    Place 650 mg rectally 3 (three) times daily.   BUSPIRONE (BUSPAR) 10 MG TABLET    Take 10 mg by mouth 3 (three) times daily.   CALCIUM CARBONATE (OS-CAL) 600 MG TABS TABLET    Take 600 mg by mouth 2 (two) times daily with a meal.   DONEPEZIL (ARICEPT) 10 MG TABLET    Take 10 mg by mouth at bedtime as needed. Take 1 tablet once daily to help preserve memory.   FAMOTIDINE (PEPCID) 20 MG TABLET    Take 20 mg by mouth daily. Take 1 tablet daily.   LORATADINE  (CLARITIN) 10 MG TABLET    Take 10 mg by mouth daily. Take 1 tablet once daily for itching skin.   MEMANTINE (NAMENDA) 10 MG TABLET    Take 24 mg by mouth daily. Take 1 tablet twice daily to help preserve memory.   POLYETHYLENE GLYCOL (MIRALAX / GLYCOLAX) PACKET    Take 17 g by mouth daily.   TRAMADOL (ULTRAM) 50 MG TABLET    Take 50 mg by mouth every 6 (six) hours as needed for pain. And tid  Modified Medications   No medications on file  Discontinued Medications   No medications on file   Physical Exam: Physical Exam  Constitutional: She is oriented to person, place, and time. She appears well-developed and well-nourished. No distress.  HENT:  Head: Normocephalic and atraumatic.  Eyes: Conjunctivae and EOM are normal. Pupils are equal, round, and reactive to light.  Neck: Normal range of motion. Neck supple. No JVD present. No thyromegaly present.  Cardiovascular: Normal rate and regular rhythm.   No murmur heard. Pulmonary/Chest: Effort normal and breath sounds normal. She has no wheezes. She has no rales.  Abdominal: Soft. Bowel sounds are normal. There is no tenderness.  Musculoskeletal: Normal range of motion. She exhibits no edema and no tenderness.  Chronic lower back pain--used to treated with Fentanyl patch--now controlled with Tylenol and Tramadol  Lymphadenopathy:    She has no cervical adenopathy.  Neurological: She is alert and oriented to person, place, and time. She displays normal reflexes. No cranial nerve deficit. She exhibits normal muscle tone. Coordination normal.  Skin: Skin is warm and dry. No rash noted. She is not diaphoretic.  Psychiatric: She has a normal mood and affect. Her behavior is normal. Thought content normal.  Except agitated when assisted with shower--no issue since she is only assisted with sponge bath now    Filed Vitals:   06/17/13 1443  BP: 120/80  Pulse: 90  Temp: 98 F (36.7 C)  TempSrc: Tympanic  Resp: 18   Labs reviewed: Basic  Metabolic Panel:  Recent Labs  16/10/96  NA 143  K 4.7  BUN 42*  CREATININE 1.3*  TSH 1.48   Liver Function Tests:  Recent Labs  07/26/12  AST 16  ALT 8  ALKPHOS 76   CBC:  Recent Labs  07/26/12  WBC 7.7  HGB 12.9  HCT 38  PLT 171   Assessment/Plan DEMENTIA Currently resides in Memory Care Unit, takes Aricept and Namenda, refuses Labs and bath            Esophageal reflux Stable on Famotidine 20mg            Unspecified constipation Stable on MiraLax.  Major depressive disorder, single episode, unspecified Stabilized on Buspar 10mg  tid.           BACK PAIN Managed with Tylenl 650mg  tid and Tramadol 50mg  tid routine and prn, she only walks with walker for a short distance.               Contusion of leg, right The upper medial aspect of the RLE-about 8cmx6cm--tender to touch, slightly warm, able to bear weight, neurovascular intact below the hematoma.     Family/ Staff Communication: observe the patient  Goals of Care: SNF  Labs/tests ordered: none

## 2013-06-17 NOTE — Assessment & Plan Note (Signed)
Managed with Tylenl 650mg tid and Tramadol 50mg tid routine and prn, she only walks with walker for a short distance.            

## 2013-06-17 NOTE — Assessment & Plan Note (Signed)
Stable on MiraLax.   

## 2013-06-17 NOTE — Assessment & Plan Note (Signed)
Currently resides in Memory Care Unit, takes Aricept and Namenda, refuses Labs and bath

## 2013-07-05 ENCOUNTER — Encounter: Payer: Self-pay | Admitting: *Deleted

## 2013-07-15 ENCOUNTER — Encounter: Payer: Self-pay | Admitting: Nurse Practitioner

## 2013-07-15 ENCOUNTER — Non-Acute Institutional Stay (SKILLED_NURSING_FACILITY): Payer: Medicare Other | Admitting: Nurse Practitioner

## 2013-07-15 DIAGNOSIS — J309 Allergic rhinitis, unspecified: Secondary | ICD-10-CM

## 2013-07-15 DIAGNOSIS — M549 Dorsalgia, unspecified: Secondary | ICD-10-CM

## 2013-07-15 DIAGNOSIS — K219 Gastro-esophageal reflux disease without esophagitis: Secondary | ICD-10-CM

## 2013-07-15 DIAGNOSIS — K5900 Constipation, unspecified: Secondary | ICD-10-CM

## 2013-07-15 DIAGNOSIS — E785 Hyperlipidemia, unspecified: Secondary | ICD-10-CM

## 2013-07-15 DIAGNOSIS — F329 Major depressive disorder, single episode, unspecified: Secondary | ICD-10-CM

## 2013-07-15 DIAGNOSIS — J302 Other seasonal allergic rhinitis: Secondary | ICD-10-CM

## 2013-07-15 DIAGNOSIS — F068 Other specified mental disorders due to known physiological condition: Secondary | ICD-10-CM

## 2013-07-15 NOTE — Assessment & Plan Note (Signed)
Stable on MiraLax.   

## 2013-07-15 NOTE — Assessment & Plan Note (Signed)
Stabilized on Buspar 10mg tid.    

## 2013-07-15 NOTE — Progress Notes (Signed)
Patient ID: Veronica Sanchez, female   DOB: [birth year], 79 y.o.   MRN: [id number]  Code Status: DNR  Allergies  Allergen Reactions  . Amoxicillin     REACTION: unspecified  . Celebrex [Celecoxib]   . Codeine Sulfate     REACTION: unspecified  . Naproxen Sodium     REACTION: unspecified  . Reminyl [Galantamine Hydrobromide]   . Versed [Midazolam]   . Vioxx [Rofecoxib]     Chief Complaint  Patient presents with  . Medical Managment of Chronic Issues    HPI: Patient is a 79 y.o. female seen in the SNF at [facility] today for evaluation of her other chronic medical conditions.  Problem List Items Addressed This Visit   BACK PAIN (Chronic)     Managed with Tylenl 650mg  tid and Tramadol 50mg  tid routine and prn, she only walks with walker for a short distance.                   DEMENTIA     Currently resides in Memory Care Unit, takes Aricept and Namenda, refuses Labs and bath                Esophageal reflux - Primary     Stable on Famotidine 20mg                HYPERLIPIDEMIA     Takes Atorvastatin 5mg  daily. Desires no further labs.           Major depressive disorder, single episode, unspecified     Stabilized on Buspar 10mg  tid.               Seasonal allergies     Symptomatic management with Loratadine 10mg  daily.           Unspecified constipation     Stable on MiraLax.                Review of Systems:  Review of Systems  Constitutional: Negative for fever, chills, weight loss, malaise/fatigue and diaphoresis.  HENT: Positive for hearing loss. Negative for congestion, ear pain and sore throat.   Eyes: Negative for pain, discharge and redness.  Respiratory: Negative for cough, sputum production and wheezing.   Cardiovascular: Negative for chest pain, palpitations, orthopnea, claudication, leg swelling and PND.  Gastrointestinal: Negative for heartburn, nausea, vomiting, abdominal pain,  constipation, blood in stool and melena.  Genitourinary: Positive for frequency. Negative for dysuria, urgency, hematuria and flank pain.  Musculoskeletal: Positive for back pain. Negative for joint pain, myalgias and neck pain.  Skin: Negative for itching and rash.       The medial right upper RLE contusion-hematoma about 8cmx6cm, mild erythema, tender-healing nicely.   Neurological: Negative for dizziness, tingling, tremors, sensory change, speech change, focal weakness, seizures, loss of consciousness, weakness and headaches.  Endo/Heme/Allergies: Negative for environmental allergies and polydipsia. Does not bruise/bleed easily.  Psychiatric/Behavioral: Positive for depression and memory loss. The patient is nervous/anxious. The patient does not have insomnia.      Past Medical History  Diagnosis Date  . Depression    Social History:   reports that she has quit smoking. Her smoking use included Cigarettes. She smoked 0.00 packs per day for 30 years. She does not have any smokeless tobacco history on file. She reports that she does not drink alcohol or use illicit drugs.  Family History  Problem Relation Age of Onset  . Stroke Mother   . Dementia Mother   . Alcohol abuse  Father   . Arthritis Sister   . Arthritis Sister     Medications: Patient's Medications  New Prescriptions   No medications on file  Previous Medications   ACETAMINOPHEN (TYLENOL) 650 MG SUPPOSITORY    Place 650 mg rectally 3 (three) times daily.   ATORVASTATIN (LIPITOR) 10 MG TABLET    Take 5 mg by mouth daily. Take 1/2 tab (5mg ) by mouth every day.   BUSPIRONE (BUSPAR) 10 MG TABLET    Take 10 mg by mouth 3 (three) times daily.   CALCIUM CARBONATE (OS-CAL) 600 MG TABS TABLET    Take 600 mg by mouth 2 (two) times daily with a meal.   DONEPEZIL (ARICEPT) 10 MG TABLET    Take 10 mg by mouth at bedtime as needed. Take 1 tablet once daily to help preserve memory.   FAMOTIDINE (PEPCID) 20 MG TABLET    Take 20 mg by  mouth daily. Take 1 tablet daily.   LORATADINE (CLARITIN) 10 MG TABLET    Take 10 mg by mouth daily. Take 1 tablet once daily for itching skin.   MEMANTINE (NAMENDA) 10 MG TABLET    Take 24 mg by mouth daily. Take 1 tablet  daily to help preserve memory.(whole or sprinkled on applesauce)   NUTRITIONAL SUPPLEMENTS (RESOURCE PO)    Take 90 mLs by mouth 2 (two) times daily. Give 90 ml by mouth twice daily with med's due to weight decline.   POLYETHYLENE GLYCOL (MIRALAX / GLYCOLAX) PACKET    Take 17 g by mouth daily. Fill cap to 17 gm mark, mix with 4 ounces of fluid and take by mouth daily.   TRAMADOL (ULTRAM) 50 MG TABLET    Take 50 mg by mouth every 6 (six) hours as needed for pain. *Not to exceed 400 mg/day.*  Modified Medications   No medications on file  Discontinued Medications   No medications on file   Physical Exam: Physical Exam  Constitutional: She is oriented to person, place, and time. She appears well-developed and well-nourished. No distress.  HENT:  Head: Normocephalic and atraumatic.  Eyes: Conjunctivae and EOM are normal. Pupils are equal, round, and reactive to light.  Neck: Normal range of motion. Neck supple. No JVD present. No thyromegaly present.  Cardiovascular: Normal rate and regular rhythm.   No murmur heard. Pulmonary/Chest: Effort normal and breath sounds normal. She has no wheezes. She has no rales.  Abdominal: Soft. Bowel sounds are normal. There is no tenderness.  Musculoskeletal: Normal range of motion. She exhibits no edema and no tenderness.  Chronic lower back pain--used to treated with Fentanyl patch--now controlled with Tylenol and Tramadol  Lymphadenopathy:    She has no cervical adenopathy.  Neurological: She is alert and oriented to person, place, and time. She displays normal reflexes. No cranial nerve deficit. She exhibits normal muscle tone. Coordination normal.  Skin: Skin is warm and dry. No rash noted. She is not diaphoretic.  Psychiatric: She  has a normal mood and affect. Her behavior is normal. Thought content normal.  Except agitated when assisted with shower--no issue since she is only assisted with sponge bath now    Filed Vitals:   07/15/13 1424  BP: 113/76  Pulse: 90  Temp: 97.8 F (36.6 C)  TempSrc: Tympanic  Resp: 16   Labs reviewed: Basic Metabolic Panel:  Recent Labs  30/86/57  NA 143  K 4.7  BUN 42*  CREATININE 1.3*  TSH 1.48   Liver Function Tests:  Recent Labs  07/26/12  AST 16  ALT 8  ALKPHOS 76   CBC:  Recent Labs  07/26/12  WBC 7.7  HGB 12.9  HCT 38  PLT 171   Assessment/Plan Esophageal reflux Stable on Famotidine 20mg              Seasonal allergies Symptomatic management with Loratadine 10mg  daily.         DEMENTIA Currently resides in Memory Care Unit, takes Aricept and Namenda, refuses Labs and bath              Unspecified constipation Stable on MiraLax.           Major depressive disorder, single episode, unspecified Stabilized on Buspar 10mg  tid.             BACK PAIN Managed with Tylenl 650mg  tid and Tramadol 50mg  tid routine and prn, she only walks with walker for a short distance.                 HYPERLIPIDEMIA Takes Atorvastatin 5mg  daily. Desires no further labs.           Family/ Staff Communication: observe the patient  Goals of Care: SNF  Labs/tests ordered: none

## 2013-07-15 NOTE — Assessment & Plan Note (Signed)
Symptomatic management with Loratadine 10mg daily.              

## 2013-07-15 NOTE — Assessment & Plan Note (Signed)
Managed with Tylenl 650mg  tid and Tramadol 50mg  tid routine and prn, she only walks with walker for a short distance.

## 2013-07-15 NOTE — Assessment & Plan Note (Signed)
Currently resides in Memory Care Unit, takes Aricept and Namenda, refuses Labs and bath

## 2013-07-15 NOTE — Assessment & Plan Note (Signed)
Stable on Famotidine 20mg   

## 2013-07-15 NOTE — Assessment & Plan Note (Signed)
Takes Atorvastatin 5mg  daily. Desires no further labs.

## 2013-07-31 ENCOUNTER — Non-Acute Institutional Stay (SKILLED_NURSING_FACILITY): Payer: Medicare Other | Admitting: Nurse Practitioner

## 2013-07-31 ENCOUNTER — Encounter: Payer: Self-pay | Admitting: Nurse Practitioner

## 2013-07-31 DIAGNOSIS — F329 Major depressive disorder, single episode, unspecified: Secondary | ICD-10-CM

## 2013-07-31 DIAGNOSIS — M549 Dorsalgia, unspecified: Secondary | ICD-10-CM

## 2013-07-31 DIAGNOSIS — J302 Other seasonal allergic rhinitis: Secondary | ICD-10-CM

## 2013-07-31 DIAGNOSIS — J309 Allergic rhinitis, unspecified: Secondary | ICD-10-CM

## 2013-07-31 DIAGNOSIS — F068 Other specified mental disorders due to known physiological condition: Secondary | ICD-10-CM

## 2013-07-31 DIAGNOSIS — K5900 Constipation, unspecified: Secondary | ICD-10-CM

## 2013-07-31 DIAGNOSIS — K219 Gastro-esophageal reflux disease without esophagitis: Secondary | ICD-10-CM

## 2013-07-31 NOTE — Assessment & Plan Note (Signed)
Stabilized on Buspar 10mg tid.    

## 2013-07-31 NOTE — Assessment & Plan Note (Signed)
Symptomatic management with Loratadine 10mg daily.              

## 2013-07-31 NOTE — Progress Notes (Signed)
Patient ID: Veronica Sanchez, female   DOB: [birth year], 79 y.o.   MRN: [id number]  Code Status: DNR  Allergies  Allergen Reactions  . Amoxicillin     REACTION: unspecified  . Celebrex [Celecoxib]   . Codeine Sulfate     REACTION: unspecified  . Naproxen Sodium     REACTION: unspecified  . Reminyl [Galantamine Hydrobromide]   . Versed [Midazolam]   . Vioxx [Rofecoxib]     Chief Complaint  Patient presents with  . Medical Managment of Chronic Issues  . Dementia    HPI: Patient is a 79 y.o. female seen in the SNF at [facility] today for evaluation of her other chronic medical conditions.  Problem List Items Addressed This Visit   BACK PAIN (Chronic)     Managed with Tylenl 650mg  tid and Tramadol 50mg  tid routine and prn, she only walks with walker for a short distance.                     DEMENTIA - Primary     Able to move out of memory care unit. MMSE 11/30 07/31/13,  takes Aricept and Namenda, refuses Labs and bath                  Esophageal reflux     Stable on Famotidine 20mg                  Major depressive disorder, single episode, unspecified     Stabilized on Buspar 10mg  tid.                 Seasonal allergies     Symptomatic management with Loratadine 10mg  daily.             Unspecified constipation     Stable on MiraLax.                  Review of Systems:  Review of Systems  Constitutional: Negative for fever, chills, weight loss, malaise/fatigue and diaphoresis.  HENT: Positive for hearing loss. Negative for congestion, ear pain and sore throat.   Eyes: Negative for pain, discharge and redness.  Respiratory: Negative for cough, sputum production and wheezing.   Cardiovascular: Negative for chest pain, palpitations, orthopnea, claudication, leg swelling and PND.  Gastrointestinal: Negative for heartburn, nausea, vomiting, abdominal pain, constipation, blood in stool and melena.   Genitourinary: Positive for frequency. Negative for dysuria, urgency, hematuria and flank pain.  Musculoskeletal: Positive for back pain. Negative for joint pain, myalgias and neck pain.  Skin: Negative for itching and rash.       The medial right upper RLE contusion-hematoma about 8cmx6cm, mild erythema, tender-healing nicely.   Neurological: Negative for dizziness, tingling, tremors, sensory change, speech change, focal weakness, seizures, loss of consciousness, weakness and headaches.  Endo/Heme/Allergies: Negative for environmental allergies and polydipsia. Does not bruise/bleed easily.  Psychiatric/Behavioral: Positive for depression and memory loss. The patient is nervous/anxious. The patient does not have insomnia.      Past Medical History  Diagnosis Date  . Depression    Social History:   reports that she has quit smoking. Her smoking use included Cigarettes. She smoked 0.00 packs per day for 30 years. She does not have any smokeless tobacco history on file. She reports that she does not drink alcohol or use illicit drugs.  Family History  Problem Relation Age of Onset  . Stroke Mother   . Dementia Mother   . Alcohol abuse Father   .  Arthritis Sister   . Arthritis Sister     Medications: Patient's Medications  New Prescriptions   No medications on file  Previous Medications   ACETAMINOPHEN (TYLENOL) 650 MG SUPPOSITORY    Place 650 mg rectally 3 (three) times daily.   ATORVASTATIN (LIPITOR) 10 MG TABLET    Take 5 mg by mouth daily. Take 1/2 tab (5mg ) by mouth every day.   BUSPIRONE (BUSPAR) 10 MG TABLET    Take 10 mg by mouth 3 (three) times daily.   CALCIUM CARBONATE (OS-CAL) 600 MG TABS TABLET    Take 600 mg by mouth 2 (two) times daily with a meal.   DONEPEZIL (ARICEPT) 10 MG TABLET    Take 10 mg by mouth at bedtime as needed. Take 1 tablet once daily to help preserve memory.   FAMOTIDINE (PEPCID) 20 MG TABLET    Take 20 mg by mouth daily. Take 1 tablet daily.    LORATADINE (CLARITIN) 10 MG TABLET    Take 10 mg by mouth daily. Take 1 tablet once daily for itching skin.   MEMANTINE (NAMENDA) 10 MG TABLET    Take 24 mg by mouth daily. Take 1 tablet  daily to help preserve memory.(whole or sprinkled on applesauce)   NUTRITIONAL SUPPLEMENTS (RESOURCE PO)    Take 90 mLs by mouth 2 (two) times daily. Give 90 ml by mouth twice daily with med's due to weight decline.   POLYETHYLENE GLYCOL (MIRALAX / GLYCOLAX) PACKET    Take 17 g by mouth daily. Fill cap to 17 gm mark, mix with 4 ounces of fluid and take by mouth daily.   TRAMADOL (ULTRAM) 50 MG TABLET    Take 50 mg by mouth every 6 (six) hours as needed for pain. *Not to exceed 400 mg/day.*  Modified Medications   No medications on file  Discontinued Medications   No medications on file   Physical Exam: Physical Exam  Constitutional: She is oriented to person, place, and time. She appears well-developed and well-nourished. No distress.  HENT:  Head: Normocephalic and atraumatic.  Eyes: Conjunctivae and EOM are normal. Pupils are equal, round, and reactive to light.  Neck: Normal range of motion. Neck supple. No JVD present. No thyromegaly present.  Cardiovascular: Normal rate and regular rhythm.   No murmur heard. Pulmonary/Chest: Effort normal and breath sounds normal. She has no wheezes. She has no rales.  Abdominal: Soft. Bowel sounds are normal. There is no tenderness.  Musculoskeletal: Normal range of motion. She exhibits no edema and no tenderness.  Chronic lower back pain--used to treated with Fentanyl patch--now controlled with Tylenol and Tramadol  Lymphadenopathy:    She has no cervical adenopathy.  Neurological: She is alert and oriented to person, place, and time. She displays normal reflexes. No cranial nerve deficit. She exhibits normal muscle tone. Coordination normal.  Skin: Skin is warm and dry. No rash noted. She is not diaphoretic.  Psychiatric: She has a normal mood and affect. Her  behavior is normal. Thought content normal.  Except agitated when assisted with shower--no issue since she is only assisted with sponge bath now    Filed Vitals:   07/31/13 1513  BP: 135/69  Pulse: 88  Temp: 97.1 F (36.2 C)  TempSrc: Tympanic  Resp: 18   Assessment/Plan   Family/ Staff Communication: observe the patient  Goals of Care: SNF  Labs/tests ordered: none

## 2013-07-31 NOTE — Assessment & Plan Note (Signed)
Able to move out of memory care unit. MMSE 11/30 07/31/13,  takes Aricept and Namenda, refuses Labs and bath    

## 2013-07-31 NOTE — Assessment & Plan Note (Signed)
Stable on MiraLax.   

## 2013-07-31 NOTE — Assessment & Plan Note (Signed)
Stable on Famotidine 20mg   

## 2013-07-31 NOTE — Assessment & Plan Note (Signed)
Managed with Tylenl 650mg tid and Tramadol 50mg tid routine and prn, she only walks with walker for a short distance.            

## 2013-08-28 ENCOUNTER — Non-Acute Institutional Stay (SKILLED_NURSING_FACILITY): Payer: Medicare Other | Admitting: Nurse Practitioner

## 2013-08-28 ENCOUNTER — Encounter: Payer: Self-pay | Admitting: Nurse Practitioner

## 2013-08-28 DIAGNOSIS — I10 Essential (primary) hypertension: Secondary | ICD-10-CM

## 2013-08-28 DIAGNOSIS — K219 Gastro-esophageal reflux disease without esophagitis: Secondary | ICD-10-CM

## 2013-08-28 DIAGNOSIS — F068 Other specified mental disorders due to known physiological condition: Secondary | ICD-10-CM

## 2013-08-28 DIAGNOSIS — J302 Other seasonal allergic rhinitis: Secondary | ICD-10-CM

## 2013-08-28 DIAGNOSIS — J309 Allergic rhinitis, unspecified: Secondary | ICD-10-CM

## 2013-08-28 DIAGNOSIS — F329 Major depressive disorder, single episode, unspecified: Secondary | ICD-10-CM

## 2013-08-28 DIAGNOSIS — J209 Acute bronchitis, unspecified: Secondary | ICD-10-CM | POA: Insufficient documentation

## 2013-08-28 DIAGNOSIS — K5900 Constipation, unspecified: Secondary | ICD-10-CM

## 2013-08-28 DIAGNOSIS — M549 Dorsalgia, unspecified: Secondary | ICD-10-CM

## 2013-08-28 NOTE — Assessment & Plan Note (Signed)
Congestive cough, audible central congestion, no wheezes, will start Avelox 400mg  daily for total 7 days along with FloraStor.

## 2013-08-28 NOTE — Assessment & Plan Note (Signed)
Low measurements, not taking antihypertensive agents, asymptomatic.      

## 2013-08-28 NOTE — Assessment & Plan Note (Signed)
Symptomatic management with Loratadine 10mg daily.              

## 2013-08-28 NOTE — Progress Notes (Signed)
Patient ID: Veronica Sanchez, female   DOB: [birth year], 79 y.o.   MRN: [id number]   Code Status: DNR  Allergies  Allergen Reactions  . Amoxicillin     REACTION: unspecified  . Celebrex [Celecoxib]   . Codeine Sulfate     REACTION: unspecified  . Naproxen Sodium     REACTION: unspecified  . Reminyl [Galantamine Hydrobromide]   . Versed [Midazolam]   . Vioxx [Rofecoxib]     Chief Complaint  Patient presents with  . Medical Managment of Chronic Issues    congestive cough.   . Acute Visit  . Dementia    HPI: Patient is a 79 y.o. female seen in the SNF at [facility] today for evaluation of  Congestive cough and other chronic medical conditions.  Problem List Items Addressed This Visit   Unspecified constipation     Stable on MiraLax.                 Seasonal allergies     Symptomatic management with Loratadine 10mg  daily.               Major depressive disorder, single episode, unspecified     Stabilized on Buspar 10mg  tid.                   HYPERTENSION     Low measurements, not taking antihypertensive agents, asymptomatic.       Esophageal reflux     Stable on Famotidine 20mg     DEMENTIA     Able to move out of memory care unit. MMSE 11/30 07/31/13,  takes Aricept and Namenda, refuses Labs and bath                    BACK PAIN (Chronic)     Managed with Tylenl 650mg  tid and Tramadol 50mg  tid routine and prn, she only walks with walker for a short distance.                       Acute bronchitis - Primary     Congestive cough, audible central congestion, no wheezes, will start Avelox 400mg  daily for total 7 days along with FloraStor.        Review of Systems:  Review of Systems  Constitutional: Negative for fever, chills, weight loss, malaise/fatigue and diaphoresis.  HENT: Positive for hearing loss. Negative for congestion, ear pain and sore throat.   Eyes: Negative for pain,  discharge and redness.  Respiratory: Positive for cough. Negative for sputum production and wheezing.   Cardiovascular: Negative for chest pain, palpitations, orthopnea, claudication, leg swelling and PND.  Gastrointestinal: Negative for heartburn, nausea, vomiting, abdominal pain, constipation, blood in stool and melena.  Genitourinary: Positive for frequency. Negative for dysuria, urgency, hematuria and flank pain.  Musculoskeletal: Positive for back pain. Negative for joint pain, myalgias and neck pain.  Skin: Negative for itching and rash.       The medial right upper RLE contusion-hematoma about 8cmx6cm, mild erythema, tender-healing nicely.   Neurological: Negative for dizziness, tingling, tremors, sensory change, speech change, focal weakness, seizures, loss of consciousness, weakness and headaches.  Endo/Heme/Allergies: Negative for environmental allergies and polydipsia. Does not bruise/bleed easily.  Psychiatric/Behavioral: Positive for depression and memory loss. The patient is nervous/anxious. The patient does not have insomnia.      Past Medical History  Diagnosis Date  . Depression    No past surgical history on file. Social History:   reports  that she has quit smoking. Her smoking use included Cigarettes. She smoked 0.00 packs per day for 30 years. She does not have any smokeless tobacco history on file. She reports that she does not drink alcohol or use illicit drugs.  Family History  Problem Relation Age of Onset  . Stroke Mother   . Dementia Mother   . Alcohol abuse Father   . Arthritis Sister   . Arthritis Sister     Medications: Patient's Medications  New Prescriptions   No medications on file  Previous Medications   ACETAMINOPHEN (TYLENOL) 650 MG SUPPOSITORY    Place 650 mg rectally 3 (three) times daily.   ATORVASTATIN (LIPITOR) 10 MG TABLET    Take 5 mg by mouth daily. Take 1/2 tab (5mg ) by mouth every day.   BUSPIRONE (BUSPAR) 10 MG TABLET    Take 10 mg  by mouth 3 (three) times daily.   CALCIUM CARBONATE (OS-CAL) 600 MG TABS TABLET    Take 600 mg by mouth 2 (two) times daily with a meal.   DONEPEZIL (ARICEPT) 10 MG TABLET    Take 10 mg by mouth at bedtime as needed. Take 1 tablet once daily to help preserve memory.   FAMOTIDINE (PEPCID) 20 MG TABLET    Take 20 mg by mouth daily. Take 1 tablet daily.   LORATADINE (CLARITIN) 10 MG TABLET    Take 10 mg by mouth daily. Take 1 tablet once daily for itching skin.   MEMANTINE (NAMENDA) 10 MG TABLET    Take 24 mg by mouth daily. Take 1 tablet  daily to help preserve memory.(whole or sprinkled on applesauce)   NUTRITIONAL SUPPLEMENTS (RESOURCE PO)    Take 90 mLs by mouth 2 (two) times daily. Give 90 ml by mouth twice daily with med's due to weight decline.   POLYETHYLENE GLYCOL (MIRALAX / GLYCOLAX) PACKET    Take 17 g by mouth daily. Fill cap to 17 gm mark, mix with 4 ounces of fluid and take by mouth daily.   TRAMADOL (ULTRAM) 50 MG TABLET    Take 50 mg by mouth every 6 (six) hours as needed for pain. *Not to exceed 400 mg/day.*  Modified Medications   No medications on file  Discontinued Medications   No medications on file     Physical Exam: Physical Exam  Constitutional: She is oriented to person, place, and time. She appears well-developed and well-nourished. No distress.  HENT:  Head: Normocephalic and atraumatic.  Eyes: Conjunctivae and EOM are normal. Pupils are equal, round, and reactive to light.  Neck: Normal range of motion. Neck supple. No JVD present. No thyromegaly present.  Cardiovascular: Normal rate and regular rhythm.   No murmur heard. Pulmonary/Chest: Effort normal and breath sounds normal. She has no wheezes. She has no rales.  Abdominal: Soft. Bowel sounds are normal. There is no tenderness.  Musculoskeletal: Normal range of motion. She exhibits no edema and no tenderness.  Chronic lower back pain--used to treated with Fentanyl patch--now controlled with Tylenol and  Tramadol  Lymphadenopathy:    She has no cervical adenopathy.  Neurological: She is alert and oriented to person, place, and time. She displays normal reflexes. No cranial nerve deficit. She exhibits normal muscle tone. Coordination normal.  Skin: Skin is warm and dry. No rash noted. She is not diaphoretic.  Psychiatric: She has a normal mood and affect. Her behavior is normal. Thought content normal.  Except agitated when assisted with shower--no issue since she is only assisted  with sponge bath now    Filed Vitals:   08/28/13 1635  BP: 120/66  Pulse: 68  Temp: 97.1 F (36.2 C)  TempSrc: Tympanic  Resp: 20      Labs reviewed:  Refusal.   Assessment/Plan Acute bronchitis Congestive cough, audible central congestion, no wheezes, will start Avelox 400mg  daily for total 7 days along with FloraStor.   Unspecified constipation Stable on MiraLax.               Major depressive disorder, single episode, unspecified Stabilized on Buspar 10mg  tid.                 HYPERTENSION Low measurements, not taking antihypertensive agents, asymptomatic.     DEMENTIA Able to move out of memory care unit. MMSE 11/30 07/31/13,  takes Aricept and Namenda, refuses Labs and bath                  Esophageal reflux Stable on Famotidine 20mg   BACK PAIN Managed with Tylenl 650mg  tid and Tramadol 50mg  tid routine and prn, she only walks with walker for a short distance.                     Seasonal allergies Symptomatic management with Loratadine 10mg  daily.               Family/ Staff Communication: observe the patient  Goals of Care: SNF  Labs/tests ordered: none

## 2013-08-28 NOTE — Assessment & Plan Note (Signed)
Able to move out of memory care unit. MMSE 11/30 07/31/13,  takes Aricept and Namenda, refuses Labs and bath    

## 2013-08-28 NOTE — Assessment & Plan Note (Signed)
Managed with Tylenl 650mg tid and Tramadol 50mg tid routine and prn, she only walks with walker for a short distance.            

## 2013-08-28 NOTE — Assessment & Plan Note (Signed)
Stable on Famotidine 20mg   

## 2013-08-28 NOTE — Assessment & Plan Note (Signed)
Stable on MiraLax.   

## 2013-08-28 NOTE — Assessment & Plan Note (Signed)
Stabilized on Buspar 10mg tid.    

## 2013-10-02 ENCOUNTER — Non-Acute Institutional Stay (SKILLED_NURSING_FACILITY): Payer: Medicare Other | Admitting: Nurse Practitioner

## 2013-10-02 ENCOUNTER — Encounter: Payer: Self-pay | Admitting: Nurse Practitioner

## 2013-10-02 DIAGNOSIS — K219 Gastro-esophageal reflux disease without esophagitis: Secondary | ICD-10-CM

## 2013-10-02 DIAGNOSIS — J209 Acute bronchitis, unspecified: Secondary | ICD-10-CM

## 2013-10-02 DIAGNOSIS — J309 Allergic rhinitis, unspecified: Secondary | ICD-10-CM

## 2013-10-02 DIAGNOSIS — J302 Other seasonal allergic rhinitis: Secondary | ICD-10-CM

## 2013-10-02 DIAGNOSIS — M549 Dorsalgia, unspecified: Secondary | ICD-10-CM

## 2013-10-02 DIAGNOSIS — K5900 Constipation, unspecified: Secondary | ICD-10-CM

## 2013-10-02 DIAGNOSIS — F068 Other specified mental disorders due to known physiological condition: Secondary | ICD-10-CM

## 2013-10-02 DIAGNOSIS — F329 Major depressive disorder, single episode, unspecified: Secondary | ICD-10-CM

## 2013-10-02 DIAGNOSIS — I10 Essential (primary) hypertension: Secondary | ICD-10-CM

## 2013-10-02 NOTE — Assessment & Plan Note (Signed)
Managed with Tylenl 650mg tid and Tramadol 50mg tid routine,  she only walks with walker for a short distance.    

## 2013-10-02 NOTE — Assessment & Plan Note (Signed)
Stable on MiraLax.

## 2013-10-02 NOTE — Progress Notes (Signed)
Patient ID: Veronica Sanchez, female   DOB: 07-26-1924, 80 y.o.   MRN: [id number]   Code Status: DNR  Allergies  Allergen Reactions  . Amoxicillin     REACTION: unspecified  . Celebrex [Celecoxib]   . Codeine Sulfate     REACTION: unspecified  . Naproxen Sodium     REACTION: unspecified  . Reminyl [Galantamine Hydrobromide]   . Versed [Midazolam]   . Vioxx [Rofecoxib]     Chief Complaint  Patient presents with  . Medical Managment of Chronic Issues    HPI: Patient is a 80 y.o. female seen in the SNF at [facility] today for evaluation of chronic medical conditions.  Problem List Items Addressed This Visit   BACK PAIN (Chronic)     Managed with Tylenl 650mg  tid and Tramadol 50mg  tid routine,  she only walks with walker for a short distance.                         DEMENTIA     Able to move out of memory care unit. MMSE 11/30 07/31/13,  takes Aricept and Namenda, refuses Labs and bath                      HYPERTENSION     Low measurements, not taking antihypertensive agents, asymptomatic.         Major depressive disorder, single episode, unspecified     Stabilized on Buspar 10mg  tid.                     Esophageal reflux     Stable on Famotidine 20mg       Seasonal allergies     Symptomatic management with Loratadine 10mg  daily.                 Unspecified constipation     Stable on MiraLax.                   Acute bronchitis - Primary     Fully treated and clinically healed.        Review of Systems:  Review of Systems  Constitutional: Negative for fever, chills, weight loss, malaise/fatigue and diaphoresis.  HENT: Positive for hearing loss. Negative for congestion, ear pain and sore throat.   Eyes: Negative for pain, discharge and redness.  Respiratory: Negative for cough, sputum production and wheezing.   Cardiovascular: Negative for chest pain, palpitations,  orthopnea, claudication, leg swelling and PND.  Gastrointestinal: Negative for heartburn, nausea, vomiting, abdominal pain, constipation, blood in stool and melena.  Genitourinary: Positive for frequency. Negative for dysuria, urgency, hematuria and flank pain.  Musculoskeletal: Positive for back pain. Negative for joint pain, myalgias and neck pain.  Skin: Negative for itching and rash.       The medial right upper RLE contusion-hematoma about 8cmx6cm, mild erythema, tender-healed  Neurological: Negative for dizziness, tingling, tremors, sensory change, speech change, focal weakness, seizures, loss of consciousness, weakness and headaches.  Endo/Heme/Allergies: Negative for environmental allergies and polydipsia. Does not bruise/bleed easily.  Psychiatric/Behavioral: Positive for depression and memory loss. The patient is nervous/anxious. The patient does not have insomnia.      Past Medical History  Diagnosis Date  . Depression    Social History:   reports that she has quit smoking. Her smoking use included Cigarettes. She smoked 0.00 packs per day for 30 years. She does not have any smokeless tobacco history on file.  She reports that she does not drink alcohol or use illicit drugs.  Family History  Problem Relation Age of Onset  . Stroke Mother   . Dementia Mother   . Alcohol abuse Father   . Arthritis Sister   . Arthritis Sister     Medications: Patient's Medications  New Prescriptions   No medications on file  Previous Medications   ACETAMINOPHEN (TYLENOL) 650 MG SUPPOSITORY    Place 650 mg rectally 3 (three) times daily.   ATORVASTATIN (LIPITOR) 10 MG TABLET    Take 5 mg by mouth daily. Take 1/2 tab (5mg ) by mouth every day.   BUSPIRONE (BUSPAR) 10 MG TABLET    Take 10 mg by mouth 3 (three) times daily.   CALCIUM CARBONATE (OS-CAL) 600 MG TABS TABLET    Take 600 mg by mouth 2 (two) times daily with a meal.   DONEPEZIL (ARICEPT) 10 MG TABLET    Take 10 mg by mouth at bedtime  as needed. Take 1 tablet once daily to help preserve memory.   FAMOTIDINE (PEPCID) 20 MG TABLET    Take 20 mg by mouth daily. Take 1 tablet daily.   LORATADINE (CLARITIN) 10 MG TABLET    Take 10 mg by mouth daily. Take 1 tablet once daily for itching skin.   MEMANTINE (NAMENDA) 10 MG TABLET    Take 10 mg by mouth 2 (two) times daily. Take 1 tablet  daily to help preserve memory.(whole or sprinkled on applesauce)   NUTRITIONAL SUPPLEMENTS (RESOURCE PO)    Take 90 mLs by mouth 2 (two) times daily. Give 90 ml by mouth twice daily with med's due to weight decline.   POLYETHYLENE GLYCOL (MIRALAX / GLYCOLAX) PACKET    Take 17 g by mouth daily. Fill cap to 17 gm mark, mix with 4 ounces of fluid and take by mouth daily.   TRAMADOL (ULTRAM) 50 MG TABLET    Take 50 mg by mouth 3 (three) times daily. *Not to exceed 400 mg/day.*  Modified Medications   No medications on file  Discontinued Medications   No medications on file     Physical Exam: Physical Exam  Constitutional: She is oriented to person, place, and time. She appears well-developed and well-nourished. No distress.  HENT:  Head: Normocephalic and atraumatic.  Eyes: Conjunctivae and EOM are normal. Pupils are equal, round, and reactive to light.  Neck: Normal range of motion. Neck supple. No JVD present. No thyromegaly present.  Cardiovascular: Normal rate and regular rhythm.   No murmur heard. Pulmonary/Chest: Effort normal and breath sounds normal. She has no wheezes. She has no rales.  Abdominal: Soft. Bowel sounds are normal. There is no tenderness.  Musculoskeletal: Normal range of motion. She exhibits no edema and no tenderness.  Chronic lower back pain--used to treated with Fentanyl patch--now controlled with Tylenol and Tramadol  Lymphadenopathy:    She has no cervical adenopathy.  Neurological: She is alert and oriented to person, place, and time. She displays normal reflexes. No cranial nerve deficit. She exhibits normal muscle  tone. Coordination normal.  Skin: Skin is warm and dry. No rash noted. She is not diaphoretic.  Psychiatric: She has a normal mood and affect. Her behavior is normal. Thought content normal.  Except agitated when assisted with shower--no issue since she is only assisted with sponge bath now    Filed Vitals:   10/02/13 1606  BP: 138/78  Pulse: 74  Temp: 97.7 F (36.5 C)  TempSrc: Tympanic  Resp:  20      Labs reviewed:  Refusal.   Assessment/Plan Acute bronchitis Fully treated and clinically healed.   Unspecified constipation Stable on MiraLax.                 Seasonal allergies Symptomatic management with Loratadine 10mg  daily.               Esophageal reflux Stable on Famotidine 20mg     Major depressive disorder, single episode, unspecified Stabilized on Buspar 10mg  tid.                   HYPERTENSION Low measurements, not taking antihypertensive agents, asymptomatic.       DEMENTIA Able to move out of memory care unit. MMSE 11/30 07/31/13,  takes Aricept and Namenda, refuses Labs and bath                    BACK PAIN Managed with Tylenl 650mg  tid and Tramadol 50mg  tid routine,  she only walks with walker for a short distance.                         Family/ Staff Communication: observe the patient  Goals of Care: SNF  Labs/tests ordered: none

## 2013-10-02 NOTE — Assessment & Plan Note (Signed)
Stabilized on Buspar 10mg tid.    

## 2013-10-02 NOTE — Assessment & Plan Note (Signed)
Symptomatic management with Loratadine 10mg  daily.

## 2013-10-02 NOTE — Assessment & Plan Note (Signed)
Low measurements, not taking antihypertensive agents, asymptomatic.

## 2013-10-02 NOTE — Assessment & Plan Note (Signed)
Fully treated and clinically healed.  

## 2013-10-02 NOTE — Assessment & Plan Note (Signed)
Stable on Famotidine 20mg 

## 2013-10-02 NOTE — Assessment & Plan Note (Signed)
Able to move out of memory care unit. MMSE 11/30 07/31/13,  takes Aricept and Namenda, refuses Labs and bath

## 2013-11-06 ENCOUNTER — Encounter: Payer: Self-pay | Admitting: Nurse Practitioner

## 2013-11-06 ENCOUNTER — Non-Acute Institutional Stay (SKILLED_NURSING_FACILITY): Payer: Medicare Other | Admitting: Nurse Practitioner

## 2013-11-06 DIAGNOSIS — M549 Dorsalgia, unspecified: Secondary | ICD-10-CM

## 2013-11-06 DIAGNOSIS — K5900 Constipation, unspecified: Secondary | ICD-10-CM

## 2013-11-06 DIAGNOSIS — F068 Other specified mental disorders due to known physiological condition: Secondary | ICD-10-CM

## 2013-11-06 DIAGNOSIS — J302 Other seasonal allergic rhinitis: Secondary | ICD-10-CM

## 2013-11-06 DIAGNOSIS — J309 Allergic rhinitis, unspecified: Secondary | ICD-10-CM

## 2013-11-06 DIAGNOSIS — K219 Gastro-esophageal reflux disease without esophagitis: Secondary | ICD-10-CM

## 2013-11-06 DIAGNOSIS — F329 Major depressive disorder, single episode, unspecified: Secondary | ICD-10-CM

## 2013-11-06 NOTE — Assessment & Plan Note (Signed)
Managed with Tylenl 650mg tid and Tramadol 50mg tid routine,  she only walks with walker for a short distance.    

## 2013-11-06 NOTE — Progress Notes (Signed)
Patient ID: Veronica Sanchez, female   DOB: 1924-02-10, 80 y.o.   MRN: [id number]   Code Status: DNR  Allergies  Allergen Reactions  . Amoxicillin     REACTION: unspecified  . Celebrex [Celecoxib]   . Codeine Sulfate     REACTION: unspecified  . Naproxen Sodium     REACTION: unspecified  . Reminyl [Galantamine Hydrobromide]   . Versed [Midazolam]   . Vioxx [Rofecoxib]     Chief Complaint  Patient presents with  . Medical Managment of Chronic Issues    HPI: Patient is a 80 y.o. female seen in the SNF at [facility] today for evaluation of chronic medical conditions.  Problem List Items Addressed This Visit   BACK PAIN (Chronic)     Managed with Tylenl 650mg  tid and Tramadol 50mg  tid routine,  she only walks with walker for a short distance.                           DEMENTIA     Able to move out of memory care unit. MMSE 11/30 07/31/13,  takes Aricept and Namenda, refuses Labs and bath                        Esophageal reflux - Primary     Stable on Famotidine 20mg         Major depressive disorder, single episode, unspecified     Stabilized on Buspar 10mg  tid.                       Seasonal allergies     Symptomatic management with Loratadine 10mg  daily.                   Unspecified constipation     Stable on MiraLax.                        Review of Systems:  Review of Systems  Constitutional: Negative for fever, chills, weight loss, malaise/fatigue and diaphoresis.  HENT: Positive for hearing loss. Negative for congestion, ear pain and sore throat.   Eyes: Negative for pain, discharge and redness.  Respiratory: Negative for cough, sputum production and wheezing.   Cardiovascular: Negative for chest pain, palpitations, orthopnea, claudication, leg swelling and PND.  Gastrointestinal: Negative for heartburn, nausea, vomiting, abdominal pain, constipation, blood in  stool and melena.  Genitourinary: Positive for frequency. Negative for dysuria, urgency, hematuria and flank pain.  Musculoskeletal: Positive for back pain. Negative for joint pain, myalgias and neck pain.  Skin: Negative for itching and rash.       The medial right upper RLE contusion-hematoma about 8cmx6cm, mild erythema, tender-healed  Neurological: Negative for dizziness, tingling, tremors, sensory change, speech change, focal weakness, seizures, loss of consciousness, weakness and headaches.  Endo/Heme/Allergies: Negative for environmental allergies and polydipsia. Does not bruise/bleed easily.  Psychiatric/Behavioral: Positive for depression and memory loss. The patient is nervous/anxious. The patient does not have insomnia.      Past Medical History  Diagnosis Date  . Depression    Social History:   reports that she has quit smoking. Her smoking use included Cigarettes. She smoked 0.00 packs per day for 30 years. She does not have any smokeless tobacco history on file. She reports that she does not drink alcohol or use illicit drugs.  Family History  Problem Relation Age of Onset  .  Stroke Mother   . Dementia Mother   . Alcohol abuse Father   . Arthritis Sister   . Arthritis Sister     Medications: Patient's Medications  New Prescriptions   No medications on file  Previous Medications   ACETAMINOPHEN (TYLENOL) 650 MG SUPPOSITORY    Place 650 mg rectally 3 (three) times daily.   ATORVASTATIN (LIPITOR) 10 MG TABLET    Take 5 mg by mouth daily. Take 1/2 tab (5mg ) by mouth every day.   BUSPIRONE (BUSPAR) 10 MG TABLET    Take 10 mg by mouth 3 (three) times daily.   CALCIUM CARBONATE (OS-CAL) 600 MG TABS TABLET    Take 600 mg by mouth 2 (two) times daily with a meal.   DONEPEZIL (ARICEPT) 10 MG TABLET    Take 10 mg by mouth at bedtime as needed. Take 1 tablet once daily to help preserve memory.   FAMOTIDINE (PEPCID) 20 MG TABLET    Take 20 mg by mouth daily. Take 1 tablet daily.    LORATADINE (CLARITIN) 10 MG TABLET    Take 10 mg by mouth daily. Take 1 tablet once daily for itching skin.   MEMANTINE (NAMENDA) 10 MG TABLET    Take 10 mg by mouth 2 (two) times daily. Take 1 tablet  daily to help preserve memory.(whole or sprinkled on applesauce)   NUTRITIONAL SUPPLEMENTS (RESOURCE PO)    Take 90 mLs by mouth 2 (two) times daily. Give 90 ml by mouth twice daily with med's due to weight decline.   POLYETHYLENE GLYCOL (MIRALAX / GLYCOLAX) PACKET    Take 17 g by mouth daily. Fill cap to 17 gm mark, mix with 4 ounces of fluid and take by mouth daily.   TRAMADOL (ULTRAM) 50 MG TABLET    Take 50 mg by mouth 3 (three) times daily. *Not to exceed 400 mg/day.*  Modified Medications   No medications on file  Discontinued Medications   No medications on file     Physical Exam: Physical Exam  Constitutional: She is oriented to person, place, and time. She appears well-developed and well-nourished. No distress.  HENT:  Head: Normocephalic and atraumatic.  Eyes: Conjunctivae and EOM are normal. Pupils are equal, round, and reactive to light.  Neck: Normal range of motion. Neck supple. No JVD present. No thyromegaly present.  Cardiovascular: Normal rate and regular rhythm.   No murmur heard. Pulmonary/Chest: Effort normal and breath sounds normal. She has no wheezes. She has no rales.  Abdominal: Soft. Bowel sounds are normal. There is no tenderness.  Musculoskeletal: Normal range of motion. She exhibits no edema and no tenderness.  Chronic lower back pain--used to treated with Fentanyl patch--now controlled with Tylenol and Tramadol  Lymphadenopathy:    She has no cervical adenopathy.  Neurological: She is alert and oriented to person, place, and time. She displays normal reflexes. No cranial nerve deficit. She exhibits normal muscle tone. Coordination normal.  Skin: Skin is warm and dry. No rash noted. She is not diaphoretic.  Psychiatric: She has a normal mood and affect. Her  behavior is normal. Thought content normal.  Except agitated when assisted with shower--no issue since she is only assisted with sponge bath now    Filed Vitals:   11/06/13 1511  BP: 138/78  Pulse: 74  Temp: 97.7 F (36.5 C)  TempSrc: Tympanic  Resp: 20      Labs reviewed:  Refusal.   Assessment/Plan Esophageal reflux Stable on Famotidine 20mg   Seasonal allergies Symptomatic management with Loratadine 10mg  daily.                 DEMENTIA Able to move out of memory care unit. MMSE 11/30 07/31/13,  takes Aricept and Namenda, refuses Labs and bath                      Unspecified constipation Stable on MiraLax.                   Major depressive disorder, single episode, unspecified Stabilized on Buspar 10mg  tid.                     BACK PAIN Managed with Tylenl 650mg  tid and Tramadol 50mg  tid routine,  she only walks with walker for a short distance.                           Family/ Staff Communication: observe the patient  Goals of Care: SNF  Labs/tests ordered: none

## 2013-11-06 NOTE — Assessment & Plan Note (Signed)
Stable on MiraLax.   

## 2013-11-06 NOTE — Assessment & Plan Note (Signed)
Symptomatic management with Loratadine 10mg daily.              

## 2013-11-06 NOTE — Assessment & Plan Note (Signed)
Stabilized on Buspar 10mg tid.    

## 2013-11-06 NOTE — Assessment & Plan Note (Signed)
Able to move out of memory care unit. MMSE 11/30 07/31/13,  takes Aricept and Namenda, refuses Labs and bath    

## 2013-11-06 NOTE — Assessment & Plan Note (Signed)
Stable on Famotidine 20mg   

## 2013-12-04 ENCOUNTER — Encounter: Payer: Self-pay | Admitting: Nurse Practitioner

## 2013-12-04 ENCOUNTER — Non-Acute Institutional Stay (SKILLED_NURSING_FACILITY): Payer: Medicare Other | Admitting: Nurse Practitioner

## 2013-12-04 DIAGNOSIS — K219 Gastro-esophageal reflux disease without esophagitis: Secondary | ICD-10-CM

## 2013-12-04 DIAGNOSIS — F329 Major depressive disorder, single episode, unspecified: Secondary | ICD-10-CM

## 2013-12-04 DIAGNOSIS — K5900 Constipation, unspecified: Secondary | ICD-10-CM

## 2013-12-04 DIAGNOSIS — Z8669 Personal history of other diseases of the nervous system and sense organs: Secondary | ICD-10-CM

## 2013-12-04 DIAGNOSIS — F068 Other specified mental disorders due to known physiological condition: Secondary | ICD-10-CM

## 2013-12-04 DIAGNOSIS — M549 Dorsalgia, unspecified: Secondary | ICD-10-CM

## 2013-12-04 HISTORY — DX: Personal history of other diseases of the nervous system and sense organs: Z86.69

## 2013-12-04 NOTE — Assessment & Plan Note (Signed)
Able to move out of memory care unit. MMSE 11/30 07/31/13,  takes Aricept and Namenda, refuses Labs and bath    

## 2013-12-04 NOTE — Assessment & Plan Note (Signed)
Stable on Famotidine 20mg   

## 2013-12-04 NOTE — Progress Notes (Signed)
Patient ID: Veronica Sanchez, female   DOB: 28-Apr-1924, 80 y.o.   MRN: [id number]   Code Status: DNR  Allergies  Allergen Reactions  . Amoxicillin     REACTION: unspecified  . Celebrex [Celecoxib]   . Codeine Sulfate     REACTION: unspecified  . Naproxen Sodium     REACTION: unspecified  . Reminyl [Galantamine Hydrobromide]   . Versed [Midazolam]   . Vioxx [Rofecoxib]     Chief Complaint  Patient presents with  . Medical Managment of Chronic Issues    HPI: Patient is a 80 y.o. female seen in the SNF at [facility] today for evaluation of chronic medical conditions.  Problem List Items Addressed This Visit   Unspecified constipation     Stable on MiraLax.     Major depressive disorder, single episode, unspecified     Stabilized on Buspar 10mg  tid.        Hx of seizure disorder     Presently she is seizure free and off med.     Esophageal reflux     Stable on Famotidine 20mg       DEMENTIA     Able to move out of memory care unit. MMSE 11/30 07/31/13,  takes Aricept and Namenda, refuses Labs and bath       BACK PAIN - Primary (Chronic)     Managed with Tylenl 650mg  tid and Tramadol 50mg  tid routine,  she only walks with walker for a short distance.          Review of Systems:  Review of Systems  Constitutional: Negative for fever, chills, weight loss, malaise/fatigue and diaphoresis.  HENT: Positive for hearing loss. Negative for congestion, ear pain and sore throat.   Eyes: Negative for pain, discharge and redness.  Respiratory: Negative for cough, sputum production and wheezing.   Cardiovascular: Negative for chest pain, palpitations, orthopnea, claudication, leg swelling and PND.  Gastrointestinal: Negative for heartburn, nausea, vomiting, abdominal pain, constipation, blood in stool and melena.  Genitourinary: Positive for frequency. Negative for dysuria, urgency, hematuria and flank pain.  Musculoskeletal: Positive for back pain. Negative for  joint pain, myalgias and neck pain.  Skin: Negative for itching and rash.       The medial right upper RLE contusion-hematoma about 8cmx6cm, mild erythema, tender-healed  Neurological: Negative for dizziness, tingling, tremors, sensory change, speech change, focal weakness, seizures, loss of consciousness, weakness and headaches.  Endo/Heme/Allergies: Negative for environmental allergies and polydipsia. Does not bruise/bleed easily.  Psychiatric/Behavioral: Positive for depression and memory loss. The patient is nervous/anxious. The patient does not have insomnia.      Past Medical History  Diagnosis Date  . Depression    Social History:   reports that she has quit smoking. Her smoking use included Cigarettes. She smoked 0.00 packs per day for 30 years. She does not have any smokeless tobacco history on file. She reports that she does not drink alcohol or use illicit drugs.  Family History  Problem Relation Age of Onset  . Stroke Mother   . Dementia Mother   . Alcohol abuse Father   . Arthritis Sister   . Arthritis Sister     Medications: Patient's Medications  New Prescriptions   No medications on file  Previous Medications   ACETAMINOPHEN (TYLENOL) 650 MG SUPPOSITORY    Place 650 mg rectally 3 (three) times daily.   ATORVASTATIN (LIPITOR) 10 MG TABLET    Take 5 mg by mouth daily. Take 1/2 tab (5mg )  by mouth every day.   BUSPIRONE (BUSPAR) 10 MG TABLET    Take 10 mg by mouth 3 (three) times daily.   CALCIUM CARBONATE (OS-CAL) 600 MG TABS TABLET    Take 600 mg by mouth 2 (two) times daily with a meal.   DONEPEZIL (ARICEPT) 10 MG TABLET    Take 10 mg by mouth at bedtime as needed. Take 1 tablet once daily to help preserve memory.   FAMOTIDINE (PEPCID) 20 MG TABLET    Take 20 mg by mouth daily. Take 1 tablet daily.   LORATADINE (CLARITIN) 10 MG TABLET    Take 10 mg by mouth daily. Take 1 tablet once daily for itching skin.   MEMANTINE (NAMENDA) 10 MG TABLET    Take 10 mg by mouth 2  (two) times daily. Take 1 tablet  daily to help preserve memory.(whole or sprinkled on applesauce)   NUTRITIONAL SUPPLEMENTS (RESOURCE PO)    Take 90 mLs by mouth 2 (two) times daily. Give 90 ml by mouth twice daily with med's due to weight decline.   POLYETHYLENE GLYCOL (MIRALAX / GLYCOLAX) PACKET    Take 17 g by mouth daily. Fill cap to 17 gm mark, mix with 4 ounces of fluid and take by mouth daily.   TRAMADOL (ULTRAM) 50 MG TABLET    Take 50 mg by mouth 3 (three) times daily. *Not to exceed 400 mg/day.*  Modified Medications   No medications on file  Discontinued Medications   No medications on file     Physical Exam: Physical Exam  Constitutional: She is oriented to person, place, and time. She appears well-developed and well-nourished. No distress.  HENT:  Head: Normocephalic and atraumatic.  Eyes: Conjunctivae and EOM are normal. Pupils are equal, round, and reactive to light.  Neck: Normal range of motion. Neck supple. No JVD present. No thyromegaly present.  Cardiovascular: Normal rate and regular rhythm.   No murmur heard. Pulmonary/Chest: Effort normal and breath sounds normal. She has no wheezes. She has no rales.  Abdominal: Soft. Bowel sounds are normal. There is no tenderness.  Musculoskeletal: Normal range of motion. She exhibits no edema and no tenderness.  Chronic lower back pain--used to treated with Fentanyl patch--now controlled with Tylenol and Tramadol  Lymphadenopathy:    She has no cervical adenopathy.  Neurological: She is alert and oriented to person, place, and time. She displays normal reflexes. No cranial nerve deficit. She exhibits normal muscle tone. Coordination normal.  Skin: Skin is warm and dry. No rash noted. She is not diaphoretic.  Psychiatric: She has a normal mood and affect. Her behavior is normal. Thought content normal.  Except agitated when assisted with shower--no issue since she is only assisted with sponge bath now    Filed Vitals:    12/04/13 1225  BP: 130/70  Pulse: 72  Temp: 98.2 F (36.8 C)  TempSrc: Tympanic  Resp: 16      Labs reviewed:  Refusal.   Assessment/Plan BACK PAIN Managed with Tylenl 650mg  tid and Tramadol 50mg  tid routine,  she only walks with walker for a short distance.     DEMENTIA Able to move out of memory care unit. MMSE 11/30 07/31/13,  takes Aricept and Namenda, refuses Labs and bath     Esophageal reflux Stable on Famotidine 20mg     Major depressive disorder, single episode, unspecified Stabilized on Buspar 10mg  tid.      Unspecified constipation Stable on MiraLax.   Hx of seizure disorder Presently she is  seizure free and off med.     Family/ Staff Communication: observe the patient  Goals of Care: SNF  Labs/tests ordered: none

## 2013-12-04 NOTE — Assessment & Plan Note (Signed)
Stable on MiraLax.   

## 2013-12-04 NOTE — Assessment & Plan Note (Signed)
Presently she is seizure free and off med.

## 2013-12-04 NOTE — Assessment & Plan Note (Signed)
Managed with Tylenl 650mg  tid and Tramadol 50mg  tid routine,  she only walks with walker for a short distance.

## 2013-12-04 NOTE — Assessment & Plan Note (Signed)
Stabilized on Buspar 10mg tid.    

## 2014-01-01 ENCOUNTER — Encounter: Payer: Self-pay | Admitting: Nurse Practitioner

## 2014-01-01 ENCOUNTER — Non-Acute Institutional Stay (SKILLED_NURSING_FACILITY): Payer: Medicare Other | Admitting: Nurse Practitioner

## 2014-01-01 DIAGNOSIS — K219 Gastro-esophageal reflux disease without esophagitis: Secondary | ICD-10-CM

## 2014-01-01 DIAGNOSIS — F329 Major depressive disorder, single episode, unspecified: Secondary | ICD-10-CM

## 2014-01-01 DIAGNOSIS — F068 Other specified mental disorders due to known physiological condition: Secondary | ICD-10-CM

## 2014-01-01 DIAGNOSIS — K5900 Constipation, unspecified: Secondary | ICD-10-CM

## 2014-01-01 DIAGNOSIS — J302 Other seasonal allergic rhinitis: Secondary | ICD-10-CM

## 2014-01-01 DIAGNOSIS — J309 Allergic rhinitis, unspecified: Secondary | ICD-10-CM

## 2014-01-01 DIAGNOSIS — M549 Dorsalgia, unspecified: Secondary | ICD-10-CM

## 2014-01-01 NOTE — Progress Notes (Signed)
Patient ID: Veronica Sanchez, female   DOB: [birth year], 80 y.o.   MRN: [id number]   Code Status: DNR  Allergies  Allergen Reactions  . Amoxicillin     REACTION: unspecified  . Celebrex [Celecoxib]   . Codeine Sulfate     REACTION: unspecified  . Naproxen Sodium     REACTION: unspecified  . Reminyl [Galantamine Hydrobromide]   . Versed [Midazolam]   . Vioxx [Rofecoxib]     Chief Complaint  Patient presents with  . Medical Managment of Chronic Issues    HPI: Patient is a 80 y.o. female seen in the SNF at [facility] today for evaluation of chronic medical conditions.  Problem List Items Addressed This Visit   BACK PAIN (Chronic)     Managed with Tylenl 650mg  tid and Tramadol 50mg  tid routine,  she only walks with walker for a short distance. W/c to go further.      DEMENTIA     Able to move out of memory care unit. MMSE 11/30 07/31/13,  takes Aricept and Namenda, refuses Labs and bath       Esophageal reflux - Primary     Stable on Famotidine 20mg        Major depressive disorder, single episode, unspecified     Stabilized on Buspar 10mg  tid.      Seasonal allergies     Symptomatic management with Loratadine 10mg  daily.        Unspecified constipation     Stable on MiraLax.         Review of Systems:  Review of Systems  Constitutional: Negative for fever, chills, weight loss, malaise/fatigue and diaphoresis.  HENT: Positive for hearing loss. Negative for congestion, ear pain and sore throat.   Eyes: Negative for pain, discharge and redness.  Respiratory: Negative for cough, sputum production and wheezing.   Cardiovascular: Negative for chest pain, palpitations, orthopnea, claudication, leg swelling and PND.  Gastrointestinal: Negative for heartburn, nausea, vomiting, abdominal pain, constipation, blood in stool and melena.  Genitourinary: Positive for frequency. Negative for dysuria, urgency, hematuria and flank pain.  Musculoskeletal: Positive for  back pain. Negative for joint pain, myalgias and neck pain.  Skin: Negative for itching and rash.  Neurological: Negative for dizziness, tingling, tremors, sensory change, speech change, focal weakness, seizures, loss of consciousness, weakness and headaches.  Endo/Heme/Allergies: Negative for environmental allergies and polydipsia. Does not bruise/bleed easily.  Psychiatric/Behavioral: Positive for depression and memory loss. The patient is nervous/anxious. The patient does not have insomnia.      Past Medical History  Diagnosis Date  . Depression    Social History:   reports that she has quit smoking. Her smoking use included Cigarettes. She smoked 0.00 packs per day for 30 years. She does not have any smokeless tobacco history on file. She reports that she does not drink alcohol or use illicit drugs.  Family History  Problem Relation Age of Onset  . Stroke Mother   . Dementia Mother   . Alcohol abuse Father   . Arthritis Sister   . Arthritis Sister     Medications: Patient's Medications  New Prescriptions   No medications on file  Previous Medications   ACETAMINOPHEN (TYLENOL) 650 MG SUPPOSITORY    Place 650 mg rectally 3 (three) times daily.   ATORVASTATIN (LIPITOR) 10 MG TABLET    Take 5 mg by mouth daily. Take 1/2 tab (5mg ) by mouth every day.   BUSPIRONE (BUSPAR) 10 MG TABLET    Take  10 mg by mouth 3 (three) times daily.   CALCIUM CARBONATE (OS-CAL) 600 MG TABS TABLET    Take 600 mg by mouth 2 (two) times daily with a meal.   DONEPEZIL (ARICEPT) 10 MG TABLET    Take 10 mg by mouth at bedtime as needed. Take 1 tablet once daily to help preserve memory.   FAMOTIDINE (PEPCID) 20 MG TABLET    Take 20 mg by mouth daily. Take 1 tablet daily.   LORATADINE (CLARITIN) 10 MG TABLET    Take 10 mg by mouth daily. Take 1 tablet once daily for itching skin.   MEMANTINE (NAMENDA) 10 MG TABLET    Take 10 mg by mouth 2 (two) times daily. Take 1 tablet  daily to help preserve memory.(whole  or sprinkled on applesauce)   NUTRITIONAL SUPPLEMENTS (RESOURCE PO)    Take 90 mLs by mouth 2 (two) times daily. Give 90 ml by mouth twice daily with med's due to weight decline.   POLYETHYLENE GLYCOL (MIRALAX / GLYCOLAX) PACKET    Take 17 g by mouth daily. Fill cap to 17 gm mark, mix with 4 ounces of fluid and take by mouth daily.   TRAMADOL (ULTRAM) 50 MG TABLET    Take 50 mg by mouth 3 (three) times daily. *Not to exceed 400 mg/day.*  Modified Medications   No medications on file  Discontinued Medications   No medications on file     Physical Exam: Physical Exam  Constitutional: She is oriented to person, place, and time. She appears well-developed and well-nourished. No distress.  HENT:  Head: Normocephalic and atraumatic.  Eyes: Conjunctivae and EOM are normal. Pupils are equal, round, and reactive to light.  Neck: Normal range of motion. Neck supple. No JVD present. No thyromegaly present.  Cardiovascular: Normal rate and regular rhythm.   No murmur heard. Pulmonary/Chest: Effort normal and breath sounds normal. She has no wheezes. She has no rales.  Abdominal: Soft. Bowel sounds are normal. There is no tenderness.  Musculoskeletal: Normal range of motion. She exhibits no edema and no tenderness.  Chronic lower back pain--used to treated with Fentanyl patch--now controlled with Tylenol and Tramadol. Ambulates with walker for a few steps and w/c to go further  Lymphadenopathy:    She has no cervical adenopathy.  Neurological: She is alert and oriented to person, place, and time. She displays normal reflexes. No cranial nerve deficit. She exhibits normal muscle tone. Coordination normal.  Skin: Skin is warm and dry. No rash noted. She is not diaphoretic.  Psychiatric: She has a normal mood and affect. Her behavior is normal. Thought content normal.  Except agitated when assisted with shower--no issue since she is only assisted with sponge bath now    Filed Vitals:   01/01/14 1046   BP: 110/62  Pulse: 66  Temp: 97.3 F (36.3 C)  TempSrc: Tympanic  Resp: 18   Labs reviewed:  Refusal.   Assessment/Plan Esophageal reflux Stable on Famotidine 20mg      Seasonal allergies Symptomatic management with Loratadine 10mg  daily.      DEMENTIA Able to move out of memory care unit. MMSE 11/30 07/31/13,  takes Aricept and Namenda, refuses Labs and bath     Unspecified constipation Stable on MiraLax.    Major depressive disorder, single episode, unspecified Stabilized on Buspar 10mg  tid.    BACK PAIN Managed with Tylenl 650mg  tid and Tramadol 50mg  tid routine,  she only walks with walker for a short distance. W/c to go further.  Family/ Staff Communication: observe the patient  Goals of Care: SNF  Labs/tests ordered: none

## 2014-01-01 NOTE — Assessment & Plan Note (Signed)
Stabilized on Buspar 10mg tid.    

## 2014-01-01 NOTE — Assessment & Plan Note (Signed)
Stable on MiraLax.

## 2014-01-01 NOTE — Assessment & Plan Note (Signed)
Symptomatic management with Loratadine 10mg  daily.

## 2014-01-01 NOTE — Assessment & Plan Note (Addendum)
Managed with Tylenl 650mg  tid and Tramadol 50mg  tid routine,  she only walks with walker for a short distance. W/c to go further.

## 2014-01-01 NOTE — Assessment & Plan Note (Signed)
Stable on Famotidine 20mg   

## 2014-01-01 NOTE — Assessment & Plan Note (Signed)
Able to move out of memory care unit. MMSE 11/30 07/31/13,  takes Aricept and Namenda, refuses Labs and bath

## 2014-01-29 ENCOUNTER — Non-Acute Institutional Stay (SKILLED_NURSING_FACILITY): Payer: Medicare Other | Admitting: Nurse Practitioner

## 2014-01-29 ENCOUNTER — Encounter: Payer: Self-pay | Admitting: Nurse Practitioner

## 2014-01-29 DIAGNOSIS — J302 Other seasonal allergic rhinitis: Secondary | ICD-10-CM

## 2014-01-29 DIAGNOSIS — K5900 Constipation, unspecified: Secondary | ICD-10-CM

## 2014-01-29 DIAGNOSIS — F329 Major depressive disorder, single episode, unspecified: Secondary | ICD-10-CM

## 2014-01-29 DIAGNOSIS — F068 Other specified mental disorders due to known physiological condition: Secondary | ICD-10-CM

## 2014-01-29 DIAGNOSIS — K219 Gastro-esophageal reflux disease without esophagitis: Secondary | ICD-10-CM

## 2014-01-29 DIAGNOSIS — J309 Allergic rhinitis, unspecified: Secondary | ICD-10-CM

## 2014-01-29 DIAGNOSIS — M549 Dorsalgia, unspecified: Secondary | ICD-10-CM

## 2014-01-29 DIAGNOSIS — Z8669 Personal history of other diseases of the nervous system and sense organs: Secondary | ICD-10-CM

## 2014-01-29 NOTE — Progress Notes (Signed)
Patient ID: Veronica Sanchez, female   DOB: 1924/01/07, 80 y.o.   MRN: [id number]   Code Status: DNR  Allergies  Allergen Reactions  . Amoxicillin     REACTION: unspecified  . Celebrex [Celecoxib]   . Codeine Sulfate     REACTION: unspecified  . Naproxen Sodium     REACTION: unspecified  . Reminyl [Galantamine Hydrobromide]   . Versed [Midazolam]   . Vioxx [Rofecoxib]     Chief Complaint  Patient presents with  . Medical Management of Chronic Issues    HPI: Patient is a 80 y.o. female seen in the SNF at [facility] today for evaluation of chronic medical conditions.  Problem List Items Addressed This Visit   BACK PAIN (Chronic)     Managed with Tylenl 650mg  tid and Tramadol 50mg  tid routine,  she only walks with walker for a short distance. W/c to go further.       DEMENTIA     Able to move out of memory care unit. MMSE 11/30 07/31/13,  takes Aricept and Namenda, refuses Labs and bath       Major depressive disorder, single episode, unspecified     Stabilized on Buspar 10mg  tid.       Esophageal reflux - Primary     Stable on Famotidine 20mg       Seasonal allergies     Symptomatic management with Loratadine 10mg  daily.       Unspecified constipation     Stable on MiraLax.      Hx of seizure disorder     Presently she is seizure free and off med.         Review of Systems:  Review of Systems  Constitutional: Negative for fever, chills, weight loss, malaise/fatigue and diaphoresis.  HENT: Positive for hearing loss. Negative for congestion, ear pain and sore throat.   Eyes: Negative for pain, discharge and redness.  Respiratory: Negative for cough, sputum production and wheezing.   Cardiovascular: Negative for chest pain, palpitations, orthopnea, claudication, leg swelling and PND.  Gastrointestinal: Negative for heartburn, nausea, vomiting, abdominal pain, constipation, blood in stool and melena.  Genitourinary: Positive for frequency. Negative  for dysuria, urgency, hematuria and flank pain.  Musculoskeletal: Positive for back pain. Negative for joint pain, myalgias and neck pain.  Skin: Negative for itching and rash.  Neurological: Negative for dizziness, tingling, tremors, sensory change, speech change, focal weakness, seizures, loss of consciousness, weakness and headaches.  Endo/Heme/Allergies: Negative for environmental allergies and polydipsia. Does not bruise/bleed easily.  Psychiatric/Behavioral: Positive for depression and memory loss. The patient is nervous/anxious. The patient does not have insomnia.      Past Medical History  Diagnosis Date  . Depression    Social History:   reports that she has quit smoking. Her smoking use included Cigarettes. She smoked 0.00 packs per day for 30 years. She does not have any smokeless tobacco history on file. She reports that she does not drink alcohol or use illicit drugs.  Family History  Problem Relation Age of Onset  . Stroke Mother   . Dementia Mother   . Alcohol abuse Father   . Arthritis Sister   . Arthritis Sister     Medications: Patient's Medications  New Prescriptions   No medications on file  Previous Medications   ACETAMINOPHEN (TYLENOL) 650 MG SUPPOSITORY    Place 650 mg rectally 3 (three) times daily.   ATORVASTATIN (LIPITOR) 10 MG TABLET    Take 5 mg by  mouth daily. Take 1/2 tab (5mg ) by mouth every day.   BUSPIRONE (BUSPAR) 10 MG TABLET    Take 10 mg by mouth 3 (three) times daily.   CALCIUM CARBONATE (OS-CAL) 600 MG TABS TABLET    Take 600 mg by mouth 2 (two) times daily with a meal.   DONEPEZIL (ARICEPT) 10 MG TABLET    Take 10 mg by mouth at bedtime as needed. Take 1 tablet once daily to help preserve memory.   FAMOTIDINE (PEPCID) 20 MG TABLET    Take 20 mg by mouth daily. Take 1 tablet daily.   LORATADINE (CLARITIN) 10 MG TABLET    Take 10 mg by mouth daily. Take 1 tablet once daily for itching skin.   MEMANTINE (NAMENDA) 10 MG TABLET    Take 10 mg by  mouth 2 (two) times daily. Take 1 tablet  daily to help preserve memory.(whole or sprinkled on applesauce)   NUTRITIONAL SUPPLEMENTS (RESOURCE PO)    Take 90 mLs by mouth 2 (two) times daily. Give 90 ml by mouth twice daily with med's due to weight decline.   POLYETHYLENE GLYCOL (MIRALAX / GLYCOLAX) PACKET    Take 17 g by mouth daily. Fill cap to 17 gm mark, mix with 4 ounces of fluid and take by mouth daily.   TRAMADOL (ULTRAM) 50 MG TABLET    Take 50 mg by mouth 3 (three) times daily. *Not to exceed 400 mg/day.*  Modified Medications   No medications on file  Discontinued Medications   No medications on file     Physical Exam: Physical Exam  Constitutional: She is oriented to person, place, and time. She appears well-developed and well-nourished. No distress.  HENT:  Head: Normocephalic and atraumatic.  Eyes: Conjunctivae and EOM are normal. Pupils are equal, round, and reactive to light.  Neck: Normal range of motion. Neck supple. No JVD present. No thyromegaly present.  Cardiovascular: Normal rate and regular rhythm.   No murmur heard. Pulmonary/Chest: Effort normal and breath sounds normal. She has no wheezes. She has no rales.  Abdominal: Soft. Bowel sounds are normal. There is no tenderness.  Musculoskeletal: Normal range of motion. She exhibits no edema and no tenderness.  Chronic lower back pain--used to treated with Fentanyl patch--now controlled with Tylenol and Tramadol. Ambulates with walker for a few steps and w/c to go further  Lymphadenopathy:    She has no cervical adenopathy.  Neurological: She is alert and oriented to person, place, and time. She displays normal reflexes. No cranial nerve deficit. She exhibits normal muscle tone. Coordination normal.  Skin: Skin is warm and dry. No rash noted. She is not diaphoretic.  Psychiatric: She has a normal mood and affect. Her behavior is normal. Thought content normal.  Except agitated when assisted with shower--no issue since  she is only assisted with sponge bath now    Filed Vitals:   01/29/14 1144  BP: 120/80  Pulse: 82  Temp: 97.3 F (36.3 C)  TempSrc: Tympanic  Resp: 16   Labs reviewed:  Refusal.   Assessment/Plan Esophageal reflux Stable on Famotidine 20mg     Seasonal allergies Symptomatic management with Loratadine 10mg  daily.     DEMENTIA Able to move out of memory care unit. MMSE 11/30 07/31/13,  takes Aricept and Namenda, refuses Labs and bath     Unspecified constipation Stable on MiraLax.    Major depressive disorder, single episode, unspecified Stabilized on Buspar 10mg  tid.     Hx of seizure disorder Presently she  is seizure free and off med.    BACK PAIN Managed with Tylenl 650mg  tid and Tramadol 50mg  tid routine,  she only walks with walker for a short distance. W/c to go further.       Family/ Staff Communication: observe the patient  Goals of Care: SNF  Labs/tests ordered: none

## 2014-01-29 NOTE — Assessment & Plan Note (Signed)
Stable on Famotidine 20mg   

## 2014-01-29 NOTE — Assessment & Plan Note (Signed)
Stabilized on Buspar 10mg tid.    

## 2014-01-29 NOTE — Assessment & Plan Note (Signed)
Symptomatic management with Loratadine 10mg daily.              

## 2014-01-29 NOTE — Assessment & Plan Note (Signed)
Managed with Tylenl 650mg tid and Tramadol 50mg tid routine,  she only walks with walker for a short distance. W/c to go further.   

## 2014-01-29 NOTE — Assessment & Plan Note (Signed)
Stable on MiraLax.   

## 2014-01-29 NOTE — Assessment & Plan Note (Signed)
Presently she is seizure free and off med.

## 2014-01-29 NOTE — Assessment & Plan Note (Signed)
Able to move out of memory care unit. MMSE 11/30 07/31/13,  takes Aricept and Namenda, refuses Labs and bath    

## 2014-02-10 ENCOUNTER — Encounter: Payer: Self-pay | Admitting: Nurse Practitioner

## 2014-02-26 ENCOUNTER — Encounter: Payer: Self-pay | Admitting: Nurse Practitioner

## 2014-02-26 ENCOUNTER — Non-Acute Institutional Stay (SKILLED_NURSING_FACILITY): Payer: Medicare Other | Admitting: Nurse Practitioner

## 2014-02-26 DIAGNOSIS — M549 Dorsalgia, unspecified: Secondary | ICD-10-CM

## 2014-02-26 DIAGNOSIS — K5900 Constipation, unspecified: Secondary | ICD-10-CM

## 2014-02-26 DIAGNOSIS — F068 Other specified mental disorders due to known physiological condition: Secondary | ICD-10-CM

## 2014-02-26 DIAGNOSIS — K219 Gastro-esophageal reflux disease without esophagitis: Secondary | ICD-10-CM

## 2014-02-26 DIAGNOSIS — J302 Other seasonal allergic rhinitis: Secondary | ICD-10-CM

## 2014-02-26 DIAGNOSIS — J309 Allergic rhinitis, unspecified: Secondary | ICD-10-CM

## 2014-02-26 DIAGNOSIS — F329 Major depressive disorder, single episode, unspecified: Secondary | ICD-10-CM

## 2014-02-26 DIAGNOSIS — Z8669 Personal history of other diseases of the nervous system and sense organs: Secondary | ICD-10-CM

## 2014-02-26 NOTE — Assessment & Plan Note (Signed)
MMSE 11/30 07/31/13 and again 9/30 02/07/14. May consider dc Aricept and Namenda since her dementia has progressed to the advanced state. Refuses Labs and bath

## 2014-02-26 NOTE — Progress Notes (Signed)
Patient ID: Veronica Sanchez, female   DOB: [birth year], 80 y.o.   MRN: [id number]   Code Status: DNR  Allergies  Allergen Reactions  . Amoxicillin     REACTION: unspecified  . Celebrex [Celecoxib]   . Codeine Sulfate     REACTION: unspecified  . Naproxen Sodium     REACTION: unspecified  . Reminyl [Galantamine Hydrobromide]   . Versed [Midazolam]   . Vioxx [Rofecoxib]     Chief Complaint  Patient presents with  . Medical Management of Chronic Issues    HPI: Patient is a 80 y.o. female seen in the SNF at [facility] today for evaluation of chronic medical conditions.  Problem List Items Addressed This Visit   BACK PAIN - Primary (Chronic)     takesTylenl 650mg  tid and Tramadol 50mg  tid routine, she only walks with walker for a short distance. W/c to go further.        DEMENTIA     MMSE 11/30 07/31/13 and again 9/30 02/07/14. May consider dc Aricept and Namenda since her dementia has progressed to the advanced state. Refuses Labs and bath        Esophageal reflux     Stable on Famotidine 20mg       Hx of seizure disorder     she has been seizure free since off med.       Major depressive disorder, single episode, unspecified     Stabilized on Buspar 10mg  tid.       Seasonal allergies     She is asymptomatic with Loratadine 10mg  daily.     Unspecified constipation     Stable on MiraLax.          Review of Systems:  Review of Systems  Constitutional: Negative for fever, chills, weight loss, malaise/fatigue and diaphoresis.  HENT: Positive for hearing loss. Negative for congestion, ear pain and sore throat.   Eyes: Negative for pain, discharge and redness.  Respiratory: Negative for cough, sputum production and wheezing.   Cardiovascular: Negative for chest pain, palpitations, orthopnea, claudication, leg swelling and PND.  Gastrointestinal: Negative for heartburn, nausea, vomiting, abdominal pain, constipation, blood in stool and melena.    Genitourinary: Positive for frequency. Negative for dysuria, urgency, hematuria and flank pain.  Musculoskeletal: Positive for back pain. Negative for joint pain, myalgias and neck pain.  Skin: Negative for itching and rash.  Neurological: Negative for dizziness, tingling, tremors, sensory change, speech change, focal weakness, seizures, loss of consciousness, weakness and headaches.  Endo/Heme/Allergies: Negative for environmental allergies and polydipsia. Does not bruise/bleed easily.  Psychiatric/Behavioral: Positive for depression and memory loss. The patient is nervous/anxious. The patient does not have insomnia.      Past Medical History  Diagnosis Date  . Depression    Social History:   reports that she has quit smoking. Her smoking use included Cigarettes. She smoked 0.00 packs per day for 30 years. She does not have any smokeless tobacco history on file. She reports that she does not drink alcohol or use illicit drugs.  Family History  Problem Relation Age of Onset  . Stroke Mother   . Dementia Mother   . Alcohol abuse Father   . Arthritis Sister   . Arthritis Sister     Medications: Patient's Medications  New Prescriptions   No medications on file  Previous Medications   ACETAMINOPHEN (TYLENOL) 650 MG SUPPOSITORY    Place 650 mg rectally 3 (three) times daily.   ATORVASTATIN (LIPITOR) 10 MG TABLET  Take 5 mg by mouth daily. Take 1/2 tab (5mg ) by mouth every day.   BUSPIRONE (BUSPAR) 10 MG TABLET    Take 10 mg by mouth 3 (three) times daily.   CALCIUM CARBONATE (OS-CAL) 600 MG TABS TABLET    Take 600 mg by mouth 2 (two) times daily with a meal.   DONEPEZIL (ARICEPT) 10 MG TABLET    Take 10 mg by mouth at bedtime as needed. Take 1 tablet once daily to help preserve memory.   FAMOTIDINE (PEPCID) 20 MG TABLET    Take 20 mg by mouth daily. Take 1 tablet daily.   LORATADINE (CLARITIN) 10 MG TABLET    Take 10 mg by mouth daily. Take 1 tablet once daily for itching skin.    MEMANTINE (NAMENDA) 10 MG TABLET    Take 10 mg by mouth 2 (two) times daily. Take 1 tablet  daily to help preserve memory.(whole or sprinkled on applesauce)   NUTRITIONAL SUPPLEMENTS (RESOURCE PO)    Take 90 mLs by mouth 2 (two) times daily. Give 90 ml by mouth twice daily with med's due to weight decline.   POLYETHYLENE GLYCOL (MIRALAX / GLYCOLAX) PACKET    Take 17 g by mouth daily. Fill cap to 17 gm mark, mix with 4 ounces of fluid and take by mouth daily.   TRAMADOL (ULTRAM) 50 MG TABLET    Take 50 mg by mouth 3 (three) times daily. *Not to exceed 400 mg/day.*  Modified Medications   No medications on file  Discontinued Medications   No medications on file     Physical Exam: Physical Exam  Constitutional: She is oriented to person, place, and time. She appears well-developed and well-nourished. No distress.  HENT:  Head: Normocephalic and atraumatic.  Eyes: Conjunctivae and EOM are normal. Pupils are equal, round, and reactive to light.  Neck: Normal range of motion. Neck supple. No JVD present. No thyromegaly present.  Cardiovascular: Normal rate and regular rhythm.   No murmur heard. Pulmonary/Chest: Effort normal and breath sounds normal. She has no wheezes. She has no rales.  Abdominal: Soft. Bowel sounds are normal. There is no tenderness.  Musculoskeletal: Normal range of motion. She exhibits no edema and no tenderness.  Chronic lower back pain--used to treated with Fentanyl patch--now controlled with Tylenol and Tramadol. Ambulates with walker for a few steps and w/c to go further  Lymphadenopathy:    She has no cervical adenopathy.  Neurological: She is alert and oriented to person, place, and time. She displays normal reflexes. No cranial nerve deficit. She exhibits normal muscle tone. Coordination normal.  Skin: Skin is warm and dry. No rash noted. She is not diaphoretic.  Psychiatric: She has a normal mood and affect. Her behavior is normal. Thought content normal.  Except  agitated when assisted with shower--no issue since she is only assisted with sponge bath now    Filed Vitals:   02/26/14 1141  BP: 130/70  Pulse: 77  Temp: 98.1 F (36.7 C)  TempSrc: Tympanic  Resp: 16   Labs reviewed:  Refusal.   Assessment/Plan BACK PAIN takesTylenl 650mg  tid and Tramadol 50mg  tid routine, she only walks with walker for a short distance. W/c to go further.      DEMENTIA MMSE 11/30 07/31/13 and again 9/30 02/07/14. May consider dc Aricept and Namenda since her dementia has progressed to the advanced state. Refuses Labs and bath      Esophageal reflux Stable on Famotidine 20mg     Hx of  seizure disorder she has been seizure free since off med.     Major depressive disorder, single episode, unspecified Stabilized on Buspar 10mg  tid.     Seasonal allergies She is asymptomatic with Loratadine 10mg  daily.   Unspecified constipation Stable on MiraLax.       Family/ Staff Communication: observe the patient  Goals of Care: SNF  Labs/tests ordered: none

## 2014-02-26 NOTE — Assessment & Plan Note (Signed)
Stable on Famotidine 20mg 

## 2014-02-26 NOTE — Assessment & Plan Note (Signed)
Stable on MiraLax.   

## 2014-02-26 NOTE — Assessment & Plan Note (Signed)
takesTylenl 650mg tid and Tramadol 50mg tid routine, she only walks with walker for a short distance. W/c to go further 

## 2014-02-26 NOTE — Assessment & Plan Note (Signed)
she has been seizure free since off med.   

## 2014-02-26 NOTE — Assessment & Plan Note (Signed)
Stabilized on Buspar 10mg tid.    

## 2014-02-26 NOTE — Assessment & Plan Note (Signed)
She is asymptomatic with Loratadine 10mg  daily.

## 2014-03-19 ENCOUNTER — Encounter: Payer: Self-pay | Admitting: Nurse Practitioner

## 2014-03-26 ENCOUNTER — Encounter: Payer: Self-pay | Admitting: Nurse Practitioner

## 2014-03-26 ENCOUNTER — Non-Acute Institutional Stay (SKILLED_NURSING_FACILITY): Payer: Medicare Other | Admitting: Nurse Practitioner

## 2014-03-26 DIAGNOSIS — Z8669 Personal history of other diseases of the nervous system and sense organs: Secondary | ICD-10-CM

## 2014-03-26 DIAGNOSIS — M549 Dorsalgia, unspecified: Secondary | ICD-10-CM

## 2014-03-26 DIAGNOSIS — K5900 Constipation, unspecified: Secondary | ICD-10-CM

## 2014-03-26 DIAGNOSIS — F329 Major depressive disorder, single episode, unspecified: Secondary | ICD-10-CM

## 2014-03-26 DIAGNOSIS — J302 Other seasonal allergic rhinitis: Secondary | ICD-10-CM

## 2014-03-26 DIAGNOSIS — J309 Allergic rhinitis, unspecified: Secondary | ICD-10-CM

## 2014-03-26 DIAGNOSIS — K219 Gastro-esophageal reflux disease without esophagitis: Secondary | ICD-10-CM

## 2014-03-26 DIAGNOSIS — F068 Other specified mental disorders due to known physiological condition: Secondary | ICD-10-CM

## 2014-03-26 NOTE — Assessment & Plan Note (Signed)
she has been seizure free since off med.

## 2014-03-26 NOTE — Assessment & Plan Note (Signed)
She is asymptomatic, takes Loratadine 10mg  daily.

## 2014-03-26 NOTE — Assessment & Plan Note (Signed)
Stabilized on Buspar 10mg  tid.

## 2014-03-26 NOTE — Assessment & Plan Note (Addendum)
Stable on MiraLax daily and Senokot S II qhs.

## 2014-03-26 NOTE — Assessment & Plan Note (Signed)
takesTylenl 650mg  tid and Tramadol 50mg  tid routine, she only walks with walker for a short distance. W/c to go further

## 2014-03-26 NOTE — Assessment & Plan Note (Signed)
MMSE 11/30 07/31/13 and again 9/30 02/07/14. off Aricept and Namenda since her dementia has progressed to the advanced state. Refuses Labs and bath. Difficulty forming a sentence since off meds.

## 2014-03-26 NOTE — Assessment & Plan Note (Signed)
Stable, takes Famotidine 20mg   

## 2014-03-26 NOTE — Progress Notes (Signed)
Patient ID: Veronica Sanchez, female   DOB: July 08, 1924, 80 y.o.   MRN: [id number]   Code Status: DNR  Allergies  Allergen Reactions  . Amoxicillin     REACTION: unspecified  . Celebrex [Celecoxib]   . Codeine Sulfate     REACTION: unspecified  . Naproxen Sodium     REACTION: unspecified  . Reminyl [Galantamine Hydrobromide]   . Versed [Midazolam]   . Vioxx [Rofecoxib]     Chief Complaint  Patient presents with  . Medical Management of Chronic Issues    HPI: Patient is a 80 y.o. female seen in the SNF at [facility] today for evaluation of chronic medical conditions.  Problem List Items Addressed This Visit   BACK PAIN - Primary (Chronic)     takesTylenl 650mg  tid and Tramadol 50mg  tid routine, she only walks with walker for a short distance. W/c to go further    DEMENTIA     MMSE 11/30 07/31/13 and again 9/30 02/07/14. off Aricept and Namenda since her dementia has progressed to the advanced state. Refuses Labs and bath. Difficulty forming a sentence since off meds.       Major depressive disorder, single episode, unspecified     Stabilized on Buspar 10mg  tid.       Esophageal reflux     Stable, takes Famotidine 20mg       Seasonal allergies     She is asymptomatic, takes Loratadine 10mg  daily.      Unspecified constipation     Stable on MiraLax daily and Senokot S II qhs.       Hx of seizure disorder     she has been seizure free since off med.         Review of Systems:  Review of Systems  Constitutional: Negative for fever, chills, weight loss, malaise/fatigue and diaphoresis.  HENT: Positive for hearing loss. Negative for congestion, ear pain and sore throat.   Eyes: Negative for pain, discharge and redness.  Respiratory: Negative for cough, sputum production and wheezing.   Cardiovascular: Negative for chest pain, palpitations, orthopnea, claudication, leg swelling and PND.  Gastrointestinal: Negative for heartburn, nausea, vomiting, abdominal  pain, constipation, blood in stool and melena.  Genitourinary: Positive for frequency. Negative for dysuria, urgency, hematuria and flank pain.  Musculoskeletal: Positive for back pain. Negative for joint pain, myalgias and neck pain.  Skin: Negative for itching and rash.  Neurological: Negative for dizziness, tingling, tremors, sensory change, speech change, focal weakness, seizures, loss of consciousness, weakness and headaches.  Endo/Heme/Allergies: Negative for environmental allergies and polydipsia. Does not bruise/bleed easily.  Psychiatric/Behavioral: Positive for depression and memory loss. The patient is nervous/anxious. The patient does not have insomnia.      Past Medical History  Diagnosis Date  . Depression    Social History:   reports that she has quit smoking. Her smoking use included Cigarettes. She smoked 0.00 packs per day for 30 years. She does not have any smokeless tobacco history on file. She reports that she does not drink alcohol or use illicit drugs.  Family History  Problem Relation Age of Onset  . Stroke Mother   . Dementia Mother   . Alcohol abuse Father   . Arthritis Sister   . Arthritis Sister     Medications: Patient's Medications  New Prescriptions   No medications on file  Previous Medications   ACETAMINOPHEN (TYLENOL) 650 MG SUPPOSITORY    Place 650 mg rectally 3 (three) times daily.  ATORVASTATIN (LIPITOR) 10 MG TABLET    Take 5 mg by mouth daily. Take 1/2 tab (5mg ) by mouth every day.   BUSPIRONE (BUSPAR) 10 MG TABLET    Take 10 mg by mouth 3 (three) times daily.   CALCIUM CARBONATE (OS-CAL) 600 MG TABS TABLET    Take 600 mg by mouth 2 (two) times daily with a meal.   DONEPEZIL (ARICEPT) 10 MG TABLET    Take 10 mg by mouth at bedtime as needed. Take 1 tablet once daily to help preserve memory.   FAMOTIDINE (PEPCID) 20 MG TABLET    Take 20 mg by mouth daily. Take 1 tablet daily.   LORATADINE (CLARITIN) 10 MG TABLET    Take 10 mg by mouth daily.  Take 1 tablet once daily for itching skin.   MEMANTINE (NAMENDA) 10 MG TABLET    Take 10 mg by mouth 2 (two) times daily. Take 1 tablet  daily to help preserve memory.(whole or sprinkled on applesauce)   NUTRITIONAL SUPPLEMENTS (RESOURCE PO)    Take 90 mLs by mouth 2 (two) times daily. Give 90 ml by mouth twice daily with med's due to weight decline.   POLYETHYLENE GLYCOL (MIRALAX / GLYCOLAX) PACKET    Take 17 g by mouth daily. Fill cap to 17 gm mark, mix with 4 ounces of fluid and take by mouth daily.   TRAMADOL (ULTRAM) 50 MG TABLET    Take 50 mg by mouth 3 (three) times daily. *Not to exceed 400 mg/day.*  Modified Medications   No medications on file  Discontinued Medications   No medications on file     Physical Exam: Physical Exam  Constitutional: She is oriented to person, place, and time. She appears well-developed and well-nourished. No distress.  HENT:  Head: Normocephalic and atraumatic.  Eyes: Conjunctivae and EOM are normal. Pupils are equal, round, and reactive to light.  Neck: Normal range of motion. Neck supple. No JVD present. No thyromegaly present.  Cardiovascular: Normal rate and regular rhythm.   No murmur heard. Pulmonary/Chest: Effort normal and breath sounds normal. She has no wheezes. She has no rales.  Abdominal: Soft. Bowel sounds are normal. There is no tenderness.  Musculoskeletal: Normal range of motion. She exhibits no edema and no tenderness.  Chronic lower back pain--used to treated with Fentanyl patch--now controlled with Tylenol and Tramadol. Ambulates with walker for a few steps and w/c to go further  Lymphadenopathy:    She has no cervical adenopathy.  Neurological: She is alert and oriented to person, place, and time. She displays normal reflexes. No cranial nerve deficit. She exhibits normal muscle tone. Coordination normal.  Skin: Skin is warm and dry. No rash noted. She is not diaphoretic.  Psychiatric: She has a normal mood and affect. Her  behavior is normal. Thought content normal.  Except agitated when assisted with shower--no issue since she is only assisted with sponge bath now    Filed Vitals:   03/26/14 1617  BP: 120/70  Pulse: 82  Temp: 98.4 F (36.9 C)  TempSrc: Tympanic  Resp: 18   Labs reviewed:  Refusal.   Assessment/Plan Esophageal reflux Stable, takes Famotidine 20mg     Seasonal allergies She is asymptomatic, takes Loratadine 10mg  daily.    Unspecified constipation Stable on MiraLax daily and Senokot S II qhs.     Major depressive disorder, single episode, unspecified Stabilized on Buspar 10mg  tid.     BACK PAIN takesTylenl 650mg  tid and Tramadol 50mg  tid routine, she only  walks with walker for a short distance. W/c to go further  Hx of seizure disorder she has been seizure free since off med.    DEMENTIA MMSE 11/30 07/31/13 and again 9/30 02/07/14. off Aricept and Namenda since her dementia has progressed to the advanced state. Refuses Labs and bath. Difficulty forming a sentence since off meds.       Family/ Staff Communication: observe the patient  Goals of Care: SNF  Labs/tests ordered: none

## 2014-04-07 ENCOUNTER — Encounter: Payer: Self-pay | Admitting: Nurse Practitioner

## 2014-04-07 ENCOUNTER — Non-Acute Institutional Stay (SKILLED_NURSING_FACILITY): Payer: Medicare Other | Admitting: Nurse Practitioner

## 2014-04-07 DIAGNOSIS — E785 Hyperlipidemia, unspecified: Secondary | ICD-10-CM

## 2014-04-07 DIAGNOSIS — S60229A Contusion of unspecified hand, initial encounter: Secondary | ICD-10-CM

## 2014-04-07 DIAGNOSIS — K219 Gastro-esophageal reflux disease without esophagitis: Secondary | ICD-10-CM

## 2014-04-07 DIAGNOSIS — M549 Dorsalgia, unspecified: Secondary | ICD-10-CM

## 2014-04-07 DIAGNOSIS — S60221A Contusion of right hand, initial encounter: Secondary | ICD-10-CM

## 2014-04-07 DIAGNOSIS — K5900 Constipation, unspecified: Secondary | ICD-10-CM

## 2014-04-07 DIAGNOSIS — F329 Major depressive disorder, single episode, unspecified: Secondary | ICD-10-CM

## 2014-04-07 DIAGNOSIS — J302 Other seasonal allergic rhinitis: Secondary | ICD-10-CM

## 2014-04-07 DIAGNOSIS — F068 Other specified mental disorders due to known physiological condition: Secondary | ICD-10-CM

## 2014-04-07 DIAGNOSIS — J309 Allergic rhinitis, unspecified: Secondary | ICD-10-CM

## 2014-04-07 NOTE — Assessment & Plan Note (Signed)
Stable, takes Famotidine 20mg   

## 2014-04-07 NOTE — Assessment & Plan Note (Signed)
takesTylenl 650mg  tid and holdTramadol 50mg  tid routine, re-evaluate

## 2014-04-07 NOTE — Assessment & Plan Note (Signed)
Stable on MiraLax daily and Senokot S II qhs.

## 2014-04-07 NOTE — Assessment & Plan Note (Signed)
She is asymptomatic, dc Loratadine 10mg  daily.

## 2014-04-07 NOTE — Assessment & Plan Note (Signed)
Dc Atorvastatin R>B

## 2014-04-07 NOTE — Progress Notes (Signed)
Patient ID: Veronica Sanchez, female   DOB: 11-08-1923, 80 y.o.   MRN: [id number]   Code Status: DNR  Allergies  Allergen Reactions  . Amoxicillin     REACTION: unspecified  . Celebrex [Celecoxib]   . Codeine Sulfate     REACTION: unspecified  . Naproxen Sodium     REACTION: unspecified  . Reminyl [Galantamine Hydrobromide]   . Versed [Midazolam]   . Vioxx [Rofecoxib]     Chief Complaint  Patient presents with  . Medical Management of Chronic Issues  . Dementia    restlessness    HPI: Patient is a 80 y.o. female seen in the SNF at [facility] today for evaluation of fall, right hand contusion, restlessness, and other chronic medical conditions.  Problem List Items Addressed This Visit   BACK PAIN (Chronic)     takesTylenl 650mg  tid and holdTramadol 50mg  tid routine, re-evaluate     HYPERLIPIDEMIA     Dc Atorvastatin R>B    DEMENTIA - Primary     MMSE 9/30 02/07/14 Off Namenda and Aricept 02/26/14 04/02/14 Depakote 250mg  bid 04/07/14 hold Buspirone, Depakote, Tramadol.  Encourage oral fluid intake. Monitor for s/s of UTI.        Major depressive disorder, single episode, unspecified     Restlessness, hold Buspar 10mg  tid.       Esophageal reflux     Stable, takes Famotidine 20mg       Relevant Medications      sennosides-docusate sodium (SENOKOT-S) 8.6-50 MG tablet   Seasonal allergies     She is asymptomatic, dc Loratadine 10mg  daily.       Unspecified constipation     Stable on MiraLax daily and Senokot S II qhs.       Contusion of hand, right     Sustained from fall 04/05/14. No definite acute bony abnormalities right hand or wrist area can be identified.        Review of Systems:  Review of Systems  Constitutional: Negative for fever, chills, weight loss, malaise/fatigue and diaphoresis.  HENT: Positive for hearing loss. Negative for congestion, ear pain and sore throat.   Eyes: Negative for pain, discharge and redness.  Respiratory:  Negative for cough, sputum production and wheezing.   Cardiovascular: Negative for chest pain, palpitations, orthopnea, claudication, leg swelling and PND.  Gastrointestinal: Negative for heartburn, nausea, vomiting, abdominal pain, constipation, blood in stool and melena.  Genitourinary: Positive for frequency. Negative for dysuria, urgency, hematuria and flank pain.  Musculoskeletal: Positive for back pain and falls. Negative for joint pain, myalgias and neck pain.       04/05/14 sustained right hand contusion.   Skin: Negative for itching and rash.  Neurological: Negative for dizziness, tingling, tremors, sensory change, speech change, focal weakness, seizures, loss of consciousness, weakness and headaches.  Endo/Heme/Allergies: Negative for environmental allergies and polydipsia. Does not bruise/bleed easily.  Psychiatric/Behavioral: Positive for depression and memory loss. The patient is nervous/anxious. The patient does not have insomnia.      Past Medical History  Diagnosis Date  . Depression    History reviewed. No pertinent past surgical history. Social History:   reports that she has quit smoking. Her smoking use included Cigarettes. She smoked 0.00 packs per day for 30 years. She does not have any smokeless tobacco history on file. She reports that she does not drink alcohol or use illicit drugs.  Family History  Problem Relation Age of Onset  . Stroke Mother   . Dementia  Mother   . Alcohol abuse Father   . Arthritis Sister   . Arthritis Sister     Medications: Patient's Medications  New Prescriptions   No medications on file  Previous Medications   ACETAMINOPHEN (TYLENOL) 650 MG SUPPOSITORY    Place 650 mg rectally 3 (three) times daily.   FAMOTIDINE (PEPCID) 20 MG TABLET    Take 20 mg by mouth daily. Take 1 tablet daily.   POLYETHYLENE GLYCOL (MIRALAX / GLYCOLAX) PACKET    Take 17 g by mouth daily. Fill cap to 17 gm mark, mix with 4 ounces of fluid and take by mouth  daily.   SENNOSIDES-DOCUSATE SODIUM (SENOKOT-S) 8.6-50 MG TABLET    Take 2 tablets by mouth daily.  Modified Medications   No medications on file  Discontinued Medications   ATORVASTATIN (LIPITOR) 10 MG TABLET    Take 5 mg by mouth daily. Take 1/2 tab (5mg ) by mouth every day.   BUSPIRONE (BUSPAR) 10 MG TABLET    Take 10 mg by mouth 3 (three) times daily.   CALCIUM CARBONATE (OS-CAL) 600 MG TABS TABLET    Take 600 mg by mouth 2 (two) times daily with a meal.   DONEPEZIL (ARICEPT) 10 MG TABLET    Take 10 mg by mouth at bedtime as needed. Take 1 tablet once daily to help preserve memory.   LORATADINE (CLARITIN) 10 MG TABLET    Take 10 mg by mouth daily. Take 1 tablet once daily for itching skin.   MEMANTINE (NAMENDA) 10 MG TABLET    Take 10 mg by mouth 2 (two) times daily. Take 1 tablet  daily to help preserve memory.(whole or sprinkled on applesauce)   NUTRITIONAL SUPPLEMENTS (RESOURCE PO)    Take 90 mLs by mouth 2 (two) times daily. Give 90 ml by mouth twice daily with med's due to weight decline.   TRAMADOL (ULTRAM) 50 MG TABLET    Take 50 mg by mouth 3 (three) times daily. *Not to exceed 400 mg/day.*     Physical Exam: Physical Exam  Constitutional: She is oriented to person, place, and time. She appears well-developed and well-nourished. No distress.  HENT:  Head: Normocephalic and atraumatic.  Eyes: Conjunctivae and EOM are normal. Pupils are equal, round, and reactive to light.  Neck: Normal range of motion. Neck supple. No JVD present. No thyromegaly present.  Cardiovascular: Normal rate and regular rhythm.   No murmur heard. Pulmonary/Chest: Effort normal and breath sounds normal. She has no wheezes. She has no rales.  Abdominal: Soft. Bowel sounds are normal. There is no tenderness.  Musculoskeletal: Normal range of motion. She exhibits no edema and no tenderness.  Chronic lower back pain--used to treated with Fentanyl patch--now controlled with Tylenol and Tramadol. Ambulates  with walker for a few steps and w/c to go further  Lymphadenopathy:    She has no cervical adenopathy.  Neurological: She is alert and oriented to person, place, and time. She displays normal reflexes. No cranial nerve deficit. She exhibits normal muscle tone. Coordination normal.  Skin: Skin is warm and dry. No rash noted. She is not diaphoretic.  Psychiatric: Thought content normal. Her mood appears anxious. Her affect is inappropriate. Her speech is tangential. She is agitated and combative. Cognition and memory are impaired. She expresses impulsivity and inappropriate judgment. She exhibits a depressed mood. She exhibits abnormal recent memory and abnormal remote memory.  Restlessness    Filed Vitals:   04/07/14 1245  BP: 140/80  Pulse: 90  Temp: 98.4 F (36.9 C)  TempSrc: Tympanic  Resp: 18      Labs reviewed: Basic Metabolic Panel: No results found for this basename: NA, K, CL, CO2, GLUCOSE, BUN, CREATININE, CALCIUM, MG, PHOS, TSH,  in the last 8760 hours Liver Function Tests: No results found for this basename: AST, ALT, ALKPHOS, BILITOT, PROT, ALBUMIN,  in the last 8760 hours No results found for this basename: LIPASE, AMYLASE,  in the last 8760 hours No results found for this basename: AMMONIA,  in the last 8760 hours CBC: No results found for this basename: WBC, NEUTROABS, HGB, HCT, MCV, PLT,  in the last 8760 hours Lipid Panel: No results found for this basename: CHOL, HDL, LDLCALC, TRIG, CHOLHDL, LDLDIRECT,  in the last 8760 hours  Past Procedures:     Assessment/Plan DEMENTIA MMSE 9/30 02/07/14 Off Namenda and Aricept 02/26/14 04/02/14 Depakote 250mg  bid 04/07/14 hold Buspirone, Depakote, Tramadol.  Encourage oral fluid intake. Monitor for s/s of UTI.      Contusion of hand, right Sustained from fall 04/05/14. No definite acute bony abnormalities right hand or wrist area can be identified.   Esophageal reflux Stable, takes Famotidine 20mg     Unspecified  constipation Stable on MiraLax daily and Senokot S II qhs.     Major depressive disorder, single episode, unspecified Restlessness, hold Buspar 10mg  tid.     Seasonal allergies She is asymptomatic, dc Loratadine 10mg  daily.     BACK PAIN takesTylenl 650mg  tid and holdTramadol 50mg  tid routine, re-evaluate   HYPERLIPIDEMIA Dc Atorvastatin R>B    Family/ Staff Communication: observe the patient  Goals of Care: SNF  Labs/tests ordered: none

## 2014-04-07 NOTE — Assessment & Plan Note (Signed)
Restlessness, hold Buspar 10mg  tid.

## 2014-04-07 NOTE — Assessment & Plan Note (Signed)
MMSE 9/30 02/07/14 Off Namenda and Aricept 02/26/14 04/02/14 Depakote 250mg  bid 04/07/14 hold Buspirone, Depakote, Tramadol.  Encourage oral fluid intake. Monitor for s/s of UTI.

## 2014-04-07 NOTE — Assessment & Plan Note (Signed)
Sustained from fall 04/05/14. No definite acute bony abnormalities right hand or wrist area can be identified.

## 2014-04-22 ENCOUNTER — Encounter: Payer: Self-pay | Admitting: *Deleted

## 2014-05-14 ENCOUNTER — Encounter: Payer: Self-pay | Admitting: Nurse Practitioner

## 2014-05-21 ENCOUNTER — Encounter: Payer: Self-pay | Admitting: Nurse Practitioner

## 2014-05-21 ENCOUNTER — Non-Acute Institutional Stay (SKILLED_NURSING_FACILITY): Payer: Medicare Other | Admitting: Nurse Practitioner

## 2014-05-21 DIAGNOSIS — K219 Gastro-esophageal reflux disease without esophagitis: Secondary | ICD-10-CM

## 2014-05-21 DIAGNOSIS — F068 Other specified mental disorders due to known physiological condition: Secondary | ICD-10-CM

## 2014-05-21 DIAGNOSIS — Z8669 Personal history of other diseases of the nervous system and sense organs: Secondary | ICD-10-CM

## 2014-05-21 DIAGNOSIS — K5900 Constipation, unspecified: Secondary | ICD-10-CM

## 2014-05-21 DIAGNOSIS — M549 Dorsalgia, unspecified: Secondary | ICD-10-CM

## 2014-05-21 DIAGNOSIS — I10 Essential (primary) hypertension: Secondary | ICD-10-CM

## 2014-05-21 NOTE — Assessment & Plan Note (Addendum)
MMSE 9/30 02/07/14 Off Namenda and Aricept 02/26/14 04/02/14 Depakote 250mg  bid 04/18/14 Depakote 250mg  tid Stabilized-no longer rocking back and force, taping fingers on table constantly. She sleeps well at night.  Will decrease Depakote to bid

## 2014-05-21 NOTE — Assessment & Plan Note (Signed)
Controlled, takesTylenl 650mg tid andTramadol 50mg prn 

## 2014-05-21 NOTE — Assessment & Plan Note (Signed)
Stable, takes Famotidine 20mg   

## 2014-05-21 NOTE — Assessment & Plan Note (Signed)
Controlled w/o med.  

## 2014-05-21 NOTE — Progress Notes (Signed)
Patient ID: Veronica Sanchez, female   DOB: [birth year], 80 y.o.   MRN: [id number]   Code Status: DNR  Allergies  Allergen Reactions  . Amoxicillin     REACTION: unspecified  . Celebrex [Celecoxib]   . Codeine Sulfate     REACTION: unspecified  . Naproxen Sodium     REACTION: unspecified  . Reminyl [Galantamine Hydrobromide]   . Versed [Midazolam]   . Vioxx [Rofecoxib]     Chief Complaint  Patient presents with  . Medical Management of Chronic Issues    HPI: Patient is a 80 y.o. female seen in the SNF at [facility] today for evaluation of chronic medical conditions.  Problem List Items Addressed This Visit   BACK PAIN - Primary (Chronic)     Controlled, takesTylenl 650mg  tid andTramadol 50mg  prn     DEMENTIA     MMSE 9/30 02/07/14 Off Namenda and Aricept 02/26/14 04/02/14 Depakote 250mg  bid 04/18/14 Depakote 250mg  tid Stabilized-no longer rocking back and force, taping fingers on table constantly. She sleeps well at night.  Will decrease Depakote to bid       Esophageal reflux     Stable, takes Famotidine 20mg        Hx of seizure disorder     No active seizure activity since last visit.     HYPERTENSION     Controlled w/o med.     Unspecified constipation     Stable on MiraLax daily and Senokot S II qhs.          Review of Systems:  Review of Systems  Constitutional: Negative for fever, chills, weight loss, malaise/fatigue and diaphoresis.  HENT: Positive for hearing loss. Negative for congestion, ear pain and sore throat.   Eyes: Negative for pain, discharge and redness.  Respiratory: Negative for cough, sputum production and wheezing.   Cardiovascular: Negative for chest pain, palpitations, orthopnea, claudication, leg swelling and PND.  Gastrointestinal: Negative for heartburn, nausea, vomiting, abdominal pain, constipation, blood in stool and melena.  Genitourinary: Positive for frequency. Negative for dysuria, urgency, hematuria and flank pain.    Musculoskeletal: Positive for back pain and falls. Negative for joint pain, myalgias and neck pain.       Chronic back pain.   Skin: Negative for itching and rash.  Neurological: Negative for dizziness, tingling, tremors, sensory change, speech change, focal weakness, seizures, loss of consciousness, weakness and headaches.  Endo/Heme/Allergies: Negative for environmental allergies and polydipsia. Does not bruise/bleed easily.  Psychiatric/Behavioral: Positive for depression and memory loss. The patient is nervous/anxious. The patient does not have insomnia.      Past Medical History  Diagnosis Date  . Depression    History reviewed. No pertinent past surgical history. Social History:   reports that she has quit smoking. Her smoking use included Cigarettes. She smoked 0.00 packs per day for 30 years. She does not have any smokeless tobacco history on file. She reports that she does not drink alcohol or use illicit drugs.  Family History  Problem Relation Age of Onset  . Stroke Mother   . Dementia Mother   . Alcohol abuse Father   . Arthritis Sister   . Arthritis Sister     Medications: Patient's Medications  New Prescriptions   No medications on file  Previous Medications   ACETAMINOPHEN (TYLENOL) 650 MG SUPPOSITORY    Place 650 mg rectally 3 (three) times daily.   DIVALPROEX (DEPAKOTE) 250 MG DR TABLET    Take 250 mg by mouth  2 (two) times daily.   FAMOTIDINE (PEPCID) 20 MG TABLET    Take 20 mg by mouth daily. Take 1 tablet daily.   POLYETHYLENE GLYCOL (MIRALAX / GLYCOLAX) PACKET    Take 17 g by mouth daily. Fill cap to 17 gm mark, mix with 4 ounces of fluid and take by mouth daily.   SENNOSIDES-DOCUSATE SODIUM (SENOKOT-S) 8.6-50 MG TABLET    Take 2 tablets by mouth daily.  Modified Medications   No medications on file  Discontinued Medications   No medications on file     Physical Exam: Physical Exam  Constitutional: She is oriented to person, place, and time. She  appears well-developed and well-nourished. No distress.  HENT:  Head: Normocephalic and atraumatic.  Eyes: Conjunctivae and EOM are normal. Pupils are equal, round, and reactive to light.  Neck: Normal range of motion. Neck supple. No JVD present. No thyromegaly present.  Cardiovascular: Normal rate and regular rhythm.   No murmur heard. Pulmonary/Chest: Effort normal and breath sounds normal. She has no wheezes. She has no rales.  Abdominal: Soft. Bowel sounds are normal. There is no tenderness.  Musculoskeletal: Normal range of motion. She exhibits no edema and no tenderness.  W/c dependent. Chronic back pain is well managed with Tylenol 650mg  tid.   Lymphadenopathy:    She has no cervical adenopathy.  Neurological: She is alert and oriented to person, place, and time. She displays normal reflexes. No cranial nerve deficit. She exhibits normal muscle tone. Coordination normal.  Skin: Skin is warm and dry. No rash noted. She is not diaphoretic.  Psychiatric: Thought content normal. Her mood appears anxious. Her affect is inappropriate. Her speech is tangential. She is agitated and combative. Cognition and memory are impaired. She expresses impulsivity and inappropriate judgment. She exhibits a depressed mood. She exhibits abnormal recent memory and abnormal remote memory.  Restlessness    Filed Vitals:   05/21/14 1204  BP: 120/72  Pulse: 82  Temp: 98.4 F (36.9 C)  TempSrc: Tympanic  Resp: 20      Labs reviewed: Basic Metabolic Panel: No results found for this basename: NA, K, CL, CO2, GLUCOSE, BUN, CREATININE, CALCIUM, MG, PHOS, TSH,  in the last 8760 hours Liver Function Tests: No results found for this basename: AST, ALT, ALKPHOS, BILITOT, PROT, ALBUMIN,  in the last 8760 hours No results found for this basename: LIPASE, AMYLASE,  in the last 8760 hours No results found for this basename: AMMONIA,  in the last 8760 hours CBC: No results found for this basename: WBC,  NEUTROABS, HGB, HCT, MCV, PLT,  in the last 8760 hours Lipid Panel: No results found for this basename: CHOL, HDL, LDLCALC, TRIG, CHOLHDL, LDLDIRECT,  in the last 8760 hours  Past Procedures:  NA  Assessment/Plan DEMENTIA MMSE 9/30 02/07/14 Off Namenda and Aricept 02/26/14 04/02/14 Depakote 250mg  bid 04/18/14 Depakote 250mg  tid Stabilized-no longer rocking back and force, taping fingers on table constantly. She sleeps well at night.  Will decrease Depakote to bid     Esophageal reflux Stable, takes Famotidine 20mg      Unspecified constipation Stable on MiraLax daily and Senokot S II qhs.     BACK PAIN Controlled, takesTylenl 650mg  tid andTramadol 50mg  prn   HYPERTENSION Controlled w/o med.   Hx of seizure disorder No active seizure activity since last visit.     Family/ Staff Communication: observe the patient  Goals of Care: SNF  Labs/tests ordered: none

## 2014-05-21 NOTE — Assessment & Plan Note (Signed)
No active seizure activity since last visit.

## 2014-05-21 NOTE — Assessment & Plan Note (Signed)
Stable on MiraLax daily and Senokot S II qhs.

## 2014-07-24 ENCOUNTER — Encounter: Payer: Self-pay | Admitting: Nurse Practitioner

## 2014-07-24 ENCOUNTER — Non-Acute Institutional Stay (SKILLED_NURSING_FACILITY): Payer: Medicare Other | Admitting: Nurse Practitioner

## 2014-07-24 DIAGNOSIS — K5900 Constipation, unspecified: Secondary | ICD-10-CM

## 2014-07-24 DIAGNOSIS — M5440 Lumbago with sciatica, unspecified side: Secondary | ICD-10-CM

## 2014-07-24 DIAGNOSIS — F0280 Dementia in other diseases classified elsewhere without behavioral disturbance: Secondary | ICD-10-CM

## 2014-07-24 DIAGNOSIS — F39 Unspecified mood [affective] disorder: Secondary | ICD-10-CM

## 2014-07-24 DIAGNOSIS — K219 Gastro-esophageal reflux disease without esophagitis: Secondary | ICD-10-CM

## 2014-07-24 NOTE — Assessment & Plan Note (Signed)
04/02/14 Depakote 250mg  bid 04/07/14 hold Buspirone, Depakote, Tramadol(dc'd Buspirone and changed Tramadol prn subsequently) 07/24/14 stable, takes Depakote 250mg  bid.

## 2014-07-24 NOTE — Assessment & Plan Note (Signed)
Controlled, takesTylenl 650mg tid andTramadol 50mg prn 

## 2014-07-24 NOTE — Progress Notes (Signed)
Patient ID: Veronica Sanchez, female   DOB: 01-17-1924, 79 y.o.   MRN: [id number]   Code Status: DNR  Allergies  Allergen Reactions  . Amoxicillin     REACTION: unspecified  . Celebrex [Celecoxib]   . Codeine Sulfate     REACTION: unspecified  . Naproxen Sodium     REACTION: unspecified  . Reminyl [Galantamine Hydrobromide]   . Versed [Midazolam]   . Vioxx [Rofecoxib]     Chief Complaint  Patient presents with  . Medical Management of Chronic Issues    HPI: Patient is a 80 y.o. female seen in the SNF at [facility] today for evaluation of chronic medical conditions.  Problem List Items Addressed This Visit   Lower back pain     Controlled, takesTylenl 650mg  tid andTramadol 50mg  prn      Esophageal reflux     Stable, takes Famotidine 20mg       Dementia in chronic affective disorder - Primary     04/02/14 Depakote 250mg  bid 04/07/14 hold Buspirone, Depakote, Tramadol(dc'd Buspirone and changed Tramadol prn subsequently) 07/24/14 stable, takes Depakote 250mg  bid.       Constipation     Stable on MiraLax qod and Senokot S II qhs.           Review of Systems:  Review of Systems  Constitutional: Negative for fever, chills, weight loss, malaise/fatigue and diaphoresis.       Weights stable.   HENT: Positive for hearing loss. Negative for congestion, ear pain and sore throat.   Eyes: Negative for pain, discharge and redness.  Respiratory: Negative for cough, sputum production and wheezing.   Cardiovascular: Negative for chest pain, palpitations, orthopnea, claudication, leg swelling and PND.  Gastrointestinal: Negative for heartburn, nausea, vomiting, abdominal pain, constipation, blood in stool and melena.  Genitourinary: Positive for frequency. Negative for dysuria, urgency, hematuria and flank pain.  Musculoskeletal: Positive for back pain and falls. Negative for joint pain, myalgias and neck pain.       Chronic back pain.   Skin: Negative for itching and  rash.  Neurological: Negative for dizziness, tingling, tremors, sensory change, speech change, focal weakness, seizures, loss of consciousness, weakness and headaches.  Endo/Heme/Allergies: Negative for environmental allergies and polydipsia. Does not bruise/bleed easily.  Psychiatric/Behavioral: Positive for depression and memory loss. The patient is nervous/anxious. The patient does not have insomnia.      Past Medical History  Diagnosis Date  . Depression    History reviewed. No pertinent past surgical history. Social History:   reports that she has quit smoking. Her smoking use included Cigarettes. She smoked 0.00 packs per day for 30 years. She does not have any smokeless tobacco history on file. She reports that she does not drink alcohol or use illicit drugs.  Family History  Problem Relation Age of Onset  . Stroke Mother   . Dementia Mother   . Alcohol abuse Father   . Arthritis Sister   . Arthritis Sister     Medications: Patient's Medications  New Prescriptions   No medications on file  Previous Medications   ACETAMINOPHEN (TYLENOL) 650 MG SUPPOSITORY    Place 650 mg rectally 3 (three) times daily.   DIVALPROEX (DEPAKOTE) 250 MG DR TABLET    Take 250 mg by mouth 2 (two) times daily.   FAMOTIDINE (PEPCID) 20 MG TABLET    Take 20 mg by mouth daily. Take 1 tablet daily.   POLYETHYLENE GLYCOL (MIRALAX / GLYCOLAX) PACKET    Take  17 g by mouth daily. Fill cap to 17 gm mark, mix with 4 ounces of fluid and take by mouth daily.   SENNOSIDES-DOCUSATE SODIUM (SENOKOT-S) 8.6-50 MG TABLET    Take 2 tablets by mouth daily.  Modified Medications   No medications on file  Discontinued Medications   No medications on file     Physical Exam: Physical Exam  Constitutional: She is oriented to person, place, and time. She appears well-developed and well-nourished. No distress.  HENT:  Head: Normocephalic and atraumatic.  Eyes: Conjunctivae and EOM are normal. Pupils are equal,  round, and reactive to light.  Neck: Normal range of motion. Neck supple. No JVD present. No thyromegaly present.  Cardiovascular: Normal rate and regular rhythm.   No murmur heard. Pulmonary/Chest: Effort normal and breath sounds normal. She has no wheezes. She has no rales.  Abdominal: Soft. Bowel sounds are normal. There is no tenderness.  Musculoskeletal: Normal range of motion. She exhibits no edema and no tenderness.  W/c dependent. Chronic back pain is well managed with Tylenol 650mg  tid.   Lymphadenopathy:    She has no cervical adenopathy.  Neurological: She is alert and oriented to person, place, and time. She displays normal reflexes. No cranial nerve deficit. She exhibits normal muscle tone. Coordination normal.  Skin: Skin is warm and dry. No rash noted. She is not diaphoretic.  Psychiatric: Thought content normal. Her mood appears anxious. Her affect is inappropriate. Her speech is tangential. She is agitated and combative. Cognition and memory are impaired. She expresses impulsivity and inappropriate judgment. She exhibits a depressed mood. She exhibits abnormal recent memory and abnormal remote memory.  Restlessness    Filed Vitals:   07/24/14 1423  BP: 120/80  Pulse: 84  Temp: 96.9 F (36.1 C)  TempSrc: Tympanic  Resp: 20      Labs reviewed: Basic Metabolic Panel: No results found for this basename: NA, K, CL, CO2, GLUCOSE, BUN, CREATININE, CALCIUM, MG, PHOS, TSH,  in the last 8760 hours Liver Function Tests: No results found for this basename: AST, ALT, ALKPHOS, BILITOT, PROT, ALBUMIN,  in the last 8760 hours No results found for this basename: LIPASE, AMYLASE,  in the last 8760 hours No results found for this basename: AMMONIA,  in the last 8760 hours CBC: No results found for this basename: WBC, NEUTROABS, HGB, HCT, MCV, PLT,  in the last 8760 hours Lipid Panel: No results found for this basename: CHOL, HDL, LDLCALC, TRIG, CHOLHDL, LDLDIRECT,  in the last  8760 hours  Past Procedures:  NA  Assessment/Plan Constipation Stable on MiraLax qod and Senokot S II qhs.      Esophageal reflux Stable, takes Famotidine 20mg     Dementia in chronic affective disorder 04/02/14 Depakote 250mg  bid 04/07/14 hold Buspirone, Depakote, Tramadol(dc'd Buspirone and changed Tramadol prn subsequently) 07/24/14 stable, takes Depakote 250mg  bid.     Lower back pain Controlled, takesTylenl 650mg  tid andTramadol 50mg  prn      Family/ Staff Communication: observe the patient  Goals of Care: SNF  Labs/tests ordered: none

## 2014-07-24 NOTE — Assessment & Plan Note (Signed)
Stable on MiraLax qod and Senokot S II qhs.   

## 2014-07-24 NOTE — Assessment & Plan Note (Signed)
Stable, takes Famotidine 20mg   

## 2014-08-25 ENCOUNTER — Encounter: Payer: Self-pay | Admitting: Nurse Practitioner

## 2014-08-25 ENCOUNTER — Non-Acute Institutional Stay (SKILLED_NURSING_FACILITY): Payer: Medicare Other | Admitting: Nurse Practitioner

## 2014-08-25 DIAGNOSIS — K219 Gastro-esophageal reflux disease without esophagitis: Secondary | ICD-10-CM

## 2014-08-25 DIAGNOSIS — K5900 Constipation, unspecified: Secondary | ICD-10-CM

## 2014-08-25 DIAGNOSIS — F39 Unspecified mood [affective] disorder: Secondary | ICD-10-CM

## 2014-08-25 DIAGNOSIS — M5440 Lumbago with sciatica, unspecified side: Secondary | ICD-10-CM

## 2014-08-25 DIAGNOSIS — F0280 Dementia in other diseases classified elsewhere without behavioral disturbance: Secondary | ICD-10-CM

## 2014-08-25 NOTE — Assessment & Plan Note (Signed)
Stable on MiraLax qod and Senokot S II qhs.   

## 2014-08-25 NOTE — Assessment & Plan Note (Signed)
Controlled, takesTylenl 650mg tid andTramadol 50mg prn 

## 2014-08-25 NOTE — Progress Notes (Signed)
Patient ID: Veronica Sanchez, female   DOB: [birth year], 80 y.o.   MRN: [id number]   Code Status: DNR  Allergies  Allergen Reactions  . Amoxicillin     REACTION: unspecified  . Celebrex [Celecoxib]   . Codeine Sulfate     REACTION: unspecified  . Naproxen Sodium     REACTION: unspecified  . Reminyl [Galantamine Hydrobromide]   . Versed [Midazolam]   . Vioxx [Rofecoxib]     Chief Complaint  Patient presents with  . Medical Management of Chronic Issues    HPI: Patient is a 80 y.o. female seen in the SNF at [facility] today for evaluation of chronic medical conditions.  Problem List Items Addressed This Visit    Lower back pain    Controlled, takesTylenl 650mg  tid andTramadol 50mg  prn       Esophageal reflux    Stable, takes Famotidine 20mg       Dementia in chronic affective disorder - Primary    04/02/14 Depakote 250mg  bid 04/07/14 hold Buspirone, Depakote, Tramadol(dc'd Buspirone and changed Tramadol prn subsequently) 07/24/14 stable, takes Depakote 250mg  bid.        Constipation    Stable on MiraLax qod and Senokot S II qhs.            Review of Systems:  Review of Systems  Constitutional: Negative for fever, chills, weight loss, malaise/fatigue and diaphoresis.       Weights stable.   HENT: Positive for hearing loss. Negative for congestion, ear pain and sore throat.   Eyes: Negative for pain, discharge and redness.  Respiratory: Negative for cough, sputum production and wheezing.   Cardiovascular: Negative for chest pain, palpitations, orthopnea, claudication, leg swelling and PND.  Gastrointestinal: Negative for heartburn, nausea, vomiting, abdominal pain, constipation, blood in stool and melena.  Genitourinary: Positive for frequency. Negative for dysuria, urgency, hematuria and flank pain.  Musculoskeletal: Positive for back pain and falls. Negative for myalgias, joint pain and neck pain.       Chronic back pain.   Skin: Negative for itching  and rash.  Neurological: Negative for dizziness, tingling, tremors, sensory change, speech change, focal weakness, seizures, loss of consciousness, weakness and headaches.  Endo/Heme/Allergies: Negative for environmental allergies and polydipsia. Does not bruise/bleed easily.  Psychiatric/Behavioral: Positive for depression and memory loss. The patient is nervous/anxious. The patient does not have insomnia.      Past Medical History  Diagnosis Date  . Depression    History reviewed. No pertinent past surgical history. Social History:   reports that she has quit smoking. Her smoking use included Cigarettes. She smoked 0.00 packs per day for 30 years. She does not have any smokeless tobacco history on file. She reports that she does not drink alcohol or use illicit drugs.  Family History  Problem Relation Age of Onset  . Stroke Mother   . Dementia Mother   . Alcohol abuse Father   . Arthritis Sister   . Arthritis Sister     Medications: Patient's Medications  New Prescriptions   No medications on file  Previous Medications   ACETAMINOPHEN (TYLENOL) 650 MG SUPPOSITORY    Place 650 mg rectally 3 (three) times daily.   DIVALPROEX (DEPAKOTE) 250 MG DR TABLET    Take 250 mg by mouth 2 (two) times daily.   FAMOTIDINE (PEPCID) 20 MG TABLET    Take 20 mg by mouth daily. Take 1 tablet daily.   POLYETHYLENE GLYCOL (MIRALAX / GLYCOLAX) PACKET    Take  17 g by mouth daily. Fill cap to 17 gm mark, mix with 4 ounces of fluid and take by mouth daily.   SENNOSIDES-DOCUSATE SODIUM (SENOKOT-S) 8.6-50 MG TABLET    Take 2 tablets by mouth daily.  Modified Medications   No medications on file  Discontinued Medications   No medications on file     Physical Exam: Physical Exam  Constitutional: She is oriented to person, place, and time. She appears well-developed and well-nourished. No distress.  HENT:  Head: Normocephalic and atraumatic.  Eyes: Conjunctivae and EOM are normal. Pupils are equal,  round, and reactive to light.  Neck: Normal range of motion. Neck supple. No JVD present. No thyromegaly present.  Cardiovascular: Normal rate and regular rhythm.   No murmur heard. Pulmonary/Chest: Effort normal and breath sounds normal. She has no wheezes. She has no rales.  Abdominal: Soft. Bowel sounds are normal. There is no tenderness.  Musculoskeletal: Normal range of motion. She exhibits no edema or tenderness.  W/c dependent. Chronic back pain is well managed with Tylenol 650mg  tid.   Lymphadenopathy:    She has no cervical adenopathy.  Neurological: She is alert and oriented to person, place, and time. She displays normal reflexes. No cranial nerve deficit. She exhibits normal muscle tone. Coordination normal.  Skin: Skin is warm and dry. No rash noted. She is not diaphoretic.  Psychiatric: Thought content normal. Her mood appears anxious. Her affect is inappropriate. Her speech is tangential. She is agitated and combative. Cognition and memory are impaired. She expresses impulsivity and inappropriate judgment. She exhibits a depressed mood. She exhibits abnormal recent memory and abnormal remote memory.  Restlessness    Filed Vitals:   08/25/14 1703  BP: 120/60  Pulse: 64  Temp: 98.1 F (36.7 C)  TempSrc: Tympanic  Resp: 18      Labs reviewed: Basic Metabolic Panel: No results for input(s): NA, K, CL, CO2, GLUCOSE, BUN, CREATININE, CALCIUM, MG, PHOS, TSH in the last 8760 hours. Liver Function Tests: No results for input(s): AST, ALT, ALKPHOS, BILITOT, PROT, ALBUMIN in the last 8760 hours. No results for input(s): LIPASE, AMYLASE in the last 8760 hours. No results for input(s): AMMONIA in the last 8760 hours. CBC: No results for input(s): WBC, NEUTROABS, HGB, HCT, MCV, PLT in the last 8760 hours. Lipid Panel: No results for input(s): CHOL, HDL, LDLCALC, TRIG, CHOLHDL, LDLDIRECT in the last 8760 hours.  Past Procedures:  NA  Assessment/Plan Constipation Stable  on MiraLax qod and Senokot S II qhs.       Esophageal reflux Stable, takes Famotidine 20mg     Dementia in chronic affective disorder 04/02/14 Depakote 250mg  bid 04/07/14 hold Buspirone, Depakote, Tramadol(dc'd Buspirone and changed Tramadol prn subsequently) 07/24/14 stable, takes Depakote 250mg  bid.      Lower back pain Controlled, takesTylenl 650mg  tid andTramadol 50mg  prn       Family/ Staff Communication: observe the patient  Goals of Care: SNF  Labs/tests ordered: none

## 2014-08-25 NOTE — Assessment & Plan Note (Signed)
04/02/14 Depakote 250mg  bid 04/07/14 hold Buspirone, Depakote, Tramadol(dc'd Buspirone and changed Tramadol prn subsequently) 07/24/14 stable, takes Depakote 250mg  bid.

## 2014-08-25 NOTE — Assessment & Plan Note (Signed)
Stable, takes Famotidine 20mg   

## 2014-09-24 ENCOUNTER — Non-Acute Institutional Stay (SKILLED_NURSING_FACILITY): Payer: Medicare Other | Admitting: Nurse Practitioner

## 2014-09-24 ENCOUNTER — Encounter: Payer: Self-pay | Admitting: Nurse Practitioner

## 2014-09-24 DIAGNOSIS — F0280 Dementia in other diseases classified elsewhere without behavioral disturbance: Secondary | ICD-10-CM

## 2014-09-24 DIAGNOSIS — F39 Unspecified mood [affective] disorder: Secondary | ICD-10-CM

## 2014-09-24 DIAGNOSIS — K219 Gastro-esophageal reflux disease without esophagitis: Secondary | ICD-10-CM

## 2014-09-24 DIAGNOSIS — M5440 Lumbago with sciatica, unspecified side: Secondary | ICD-10-CM

## 2014-09-24 DIAGNOSIS — K5900 Constipation, unspecified: Secondary | ICD-10-CM

## 2014-09-24 NOTE — Assessment & Plan Note (Signed)
04/02/14 Depakote 250mg  bid 04/07/14 hold Buspirone, Depakote, Tramadol(dc'd Buspirone and changed Tramadol prn subsequently) 07/24/14 stable, takes Depakote 250mg  bid.  09/23/14 no change.

## 2014-09-24 NOTE — Progress Notes (Signed)
Patient ID: Veronica Sanchez, female   DOB: 05/12/1924, 80 y.o.   MRN: [id number]   Code Status: DNR  Allergies  Allergen Reactions  . Amoxicillin     REACTION: unspecified  . Celebrex [Celecoxib]   . Codeine Sulfate     REACTION: unspecified  . Naproxen Sodium     REACTION: unspecified  . Reminyl [Galantamine Hydrobromide]   . Versed [Midazolam]   . Vioxx [Rofecoxib]     Chief Complaint  Patient presents with  . Medical Management of Chronic Issues    HPI: Patient is a 80 y.o. female seen in the SNF at [facility] today for evaluation of chronic medical conditions.  Problem List Items Addressed This Visit    Lower back pain    Controlled, takesTylenl 650mg  tid andTramadol 50mg  prn    Esophageal reflux    Stable, takes Famotidine 20mg      Dementia in chronic affective disorder - Primary    04/02/14 Depakote 250mg  bid 04/07/14 hold Buspirone, Depakote, Tramadol(dc'd Buspirone and changed Tramadol prn subsequently) 07/24/14 stable, takes Depakote 250mg  bid.  09/23/14 no change.        Constipation    Stable on MiraLax qod and Senokot S II qhs.          Review of Systems:  Review of Systems  Constitutional: Negative for fever, chills, weight loss, malaise/fatigue and diaphoresis.       Weights stable.   HENT: Positive for hearing loss. Negative for congestion, ear pain and sore throat.   Eyes: Negative for pain, discharge and redness.  Respiratory: Negative for cough, sputum production and wheezing.   Cardiovascular: Negative for chest pain, palpitations, orthopnea, claudication, leg swelling and PND.  Gastrointestinal: Negative for heartburn, nausea, vomiting, abdominal pain, constipation, blood in stool and melena.  Genitourinary: Positive for frequency. Negative for dysuria, urgency, hematuria and flank pain.  Musculoskeletal: Positive for back pain and falls. Negative for myalgias, joint pain and neck pain.       Chronic back pain.   Skin: Negative for  itching and rash.  Neurological: Negative for dizziness, tingling, tremors, sensory change, speech change, focal weakness, seizures, loss of consciousness, weakness and headaches.  Endo/Heme/Allergies: Negative for environmental allergies and polydipsia. Does not bruise/bleed easily.  Psychiatric/Behavioral: Positive for depression and memory loss. The patient is nervous/anxious. The patient does not have insomnia.      Past Medical History  Diagnosis Date  . Depression    History reviewed. No pertinent past surgical history. Social History:   reports that she has quit smoking. Her smoking use included Cigarettes. She smoked 0.00 packs per day for 30 years. She does not have any smokeless tobacco history on file. She reports that she does not drink alcohol or use illicit drugs.  Family History  Problem Relation Age of Onset  . Stroke Mother   . Dementia Mother   . Alcohol abuse Father   . Arthritis Sister   . Arthritis Sister     Medications: Patient's Medications  New Prescriptions   No medications on file  Previous Medications   ACETAMINOPHEN (TYLENOL) 650 MG SUPPOSITORY    Place 650 mg rectally 3 (three) times daily.   DIVALPROEX (DEPAKOTE) 250 MG DR TABLET    Take 250 mg by mouth 2 (two) times daily.   FAMOTIDINE (PEPCID) 20 MG TABLET    Take 20 mg by mouth daily. Take 1 tablet daily.   POLYETHYLENE GLYCOL (MIRALAX / GLYCOLAX) PACKET    Take 17 g  by mouth daily. Fill cap to 17 gm mark, mix with 4 ounces of fluid and take by mouth daily.   SENNOSIDES-DOCUSATE SODIUM (SENOKOT-S) 8.6-50 MG TABLET    Take 2 tablets by mouth daily.  Modified Medications   No medications on file  Discontinued Medications   No medications on file     Physical Exam: Physical Exam  Constitutional: She is oriented to person, place, and time. She appears well-developed and well-nourished. No distress.  HENT:  Head: Normocephalic and atraumatic.  Eyes: Conjunctivae and EOM are normal. Pupils are  equal, round, and reactive to light.  Neck: Normal range of motion. Neck supple. No JVD present. No thyromegaly present.  Cardiovascular: Normal rate and regular rhythm.   No murmur heard. Pulmonary/Chest: Effort normal and breath sounds normal. She has no wheezes. She has no rales.  Abdominal: Soft. Bowel sounds are normal. There is no tenderness.  Musculoskeletal: Normal range of motion. She exhibits no edema or tenderness.  W/c dependent. Chronic back pain is well managed with Tylenol 650mg  tid.   Lymphadenopathy:    She has no cervical adenopathy.  Neurological: She is alert and oriented to person, place, and time. She displays normal reflexes. No cranial nerve deficit. She exhibits normal muscle tone. Coordination normal.  Skin: Skin is warm and dry. No rash noted. She is not diaphoretic.  Psychiatric: Thought content normal. Her mood appears anxious. Her affect is inappropriate. Her speech is tangential. She is agitated and combative. Cognition and memory are impaired. She expresses impulsivity and inappropriate judgment. She exhibits a depressed mood. She exhibits abnormal recent memory and abnormal remote memory.  Restlessness    Filed Vitals:   09/24/14 1551  BP: 120/60  Pulse: 64  Temp: 98.6 F (37 C)  TempSrc: Tympanic  Resp: 18      Labs reviewed: Basic Metabolic Panel: No results for input(s): NA, K, CL, CO2, GLUCOSE, BUN, CREATININE, CALCIUM, MG, PHOS, TSH in the last 8760 hours. Liver Function Tests: No results for input(s): AST, ALT, ALKPHOS, BILITOT, PROT, ALBUMIN in the last 8760 hours. No results for input(s): LIPASE, AMYLASE in the last 8760 hours. No results for input(s): AMMONIA in the last 8760 hours. CBC: No results for input(s): WBC, NEUTROABS, HGB, HCT, MCV, PLT in the last 8760 hours. Lipid Panel: No results for input(s): CHOL, HDL, LDLCALC, TRIG, CHOLHDL, LDLDIRECT in the last 8760 hours.  Past Procedures:  NA  Assessment/Plan Lower back  pain Controlled, takesTylenl 650mg  tid andTramadol 50mg  prn  Esophageal reflux Stable, takes Famotidine 20mg    Constipation Stable on MiraLax qod and Senokot S II qhs.     Dementia in chronic affective disorder 04/02/14 Depakote 250mg  bid 04/07/14 hold Buspirone, Depakote, Tramadol(dc'd Buspirone and changed Tramadol prn subsequently) 07/24/14 stable, takes Depakote 250mg  bid.  09/23/14 no change.        Family/ Staff Communication: observe the patient  Goals of Care: SNF  Labs/tests ordered: none

## 2014-09-24 NOTE — Assessment & Plan Note (Signed)
Stable, takes Famotidine 20mg 

## 2014-09-24 NOTE — Assessment & Plan Note (Signed)
Stable on MiraLax qod and Senokot S II qhs.

## 2014-09-24 NOTE — Assessment & Plan Note (Signed)
Controlled, takesTylenl 650mg  tid andTramadol 50mg  prn

## 2014-10-22 ENCOUNTER — Non-Acute Institutional Stay (SKILLED_NURSING_FACILITY): Payer: Medicare Other | Admitting: Nurse Practitioner

## 2014-10-22 ENCOUNTER — Encounter: Payer: Self-pay | Admitting: Nurse Practitioner

## 2014-10-22 DIAGNOSIS — F0280 Dementia in other diseases classified elsewhere without behavioral disturbance: Secondary | ICD-10-CM

## 2014-10-22 DIAGNOSIS — M5440 Lumbago with sciatica, unspecified side: Secondary | ICD-10-CM

## 2014-10-22 DIAGNOSIS — Z8669 Personal history of other diseases of the nervous system and sense organs: Secondary | ICD-10-CM

## 2014-10-22 DIAGNOSIS — K219 Gastro-esophageal reflux disease without esophagitis: Secondary | ICD-10-CM

## 2014-10-22 DIAGNOSIS — F324 Major depressive disorder, single episode, in partial remission: Secondary | ICD-10-CM

## 2014-10-22 DIAGNOSIS — I10 Essential (primary) hypertension: Secondary | ICD-10-CM

## 2014-10-22 DIAGNOSIS — K5900 Constipation, unspecified: Secondary | ICD-10-CM

## 2014-10-22 DIAGNOSIS — F39 Unspecified mood [affective] disorder: Secondary | ICD-10-CM

## 2014-10-22 NOTE — Assessment & Plan Note (Signed)
Controlled.  

## 2014-10-22 NOTE — Assessment & Plan Note (Signed)
Restlessness, mood is stabilized, continue Depakote.

## 2014-10-22 NOTE — Assessment & Plan Note (Signed)
04/02/14 Depakote 250mg  bid 04/07/14 hold Buspirone, Depakote, Tramadol(dc'd Buspirone and changed Tramadol prn subsequently) 07/24/14 stable, takes Depakote 250mg  bid.  09/23/14 no change.

## 2014-10-22 NOTE — Assessment & Plan Note (Signed)
Stable on MiraLax qod and Senokot S II qhs.

## 2014-10-22 NOTE — Assessment & Plan Note (Signed)
Controlled, takesTylenl 650mg  tid andTramadol 50mg  prn

## 2014-10-22 NOTE — Assessment & Plan Note (Signed)
Stable, takes Famotidine 20mg 

## 2014-10-22 NOTE — Progress Notes (Signed)
Patient ID: Veronica Sanchez, female   DOB: 1924-01-03, 81 y.o.   MRN: [id number]   Code Status: DNR  Allergies  Allergen Reactions  . Amoxicillin     REACTION: unspecified  . Celebrex [Celecoxib]   . Codeine Sulfate     REACTION: unspecified  . Naproxen Sodium     REACTION: unspecified  . Reminyl [Galantamine Hydrobromide]   . Versed [Midazolam]   . Vioxx [Rofecoxib]     Chief Complaint  Patient presents with  . Medical Management of Chronic Issues    HPI: Patient is a 81 y.o. female seen in the SNF at [facility] today for evaluation of chronic medical conditions.  Problem List Items Addressed This Visit    Major depressive disorder, single episode    Restlessness, mood is stabilized, continue Depakote.         Lower back pain    Controlled, takesTylenl 650mg  tid andTramadol 50mg  prn       Hx of seizure disorder    No active seizures since last seen. Continue Depakote.       Essential hypertension - Primary    Controlled.       Esophageal reflux    Stable, takes Famotidine 20mg        Dementia in chronic affective disorder    04/02/14 Depakote 250mg  bid 04/07/14 hold Buspirone, Depakote, Tramadol(dc'd Buspirone and changed Tramadol prn subsequently) 07/24/14 stable, takes Depakote 250mg  bid.  09/23/14 no change.         Constipation    Stable on MiraLax qod and Senokot S II qhs.            Review of Systems:  Review of Systems  Constitutional: Negative for fever, chills, weight loss, malaise/fatigue and diaphoresis.  HENT: Positive for hearing loss. Negative for congestion, ear discharge, ear pain, nosebleeds, sore throat and tinnitus.   Eyes: Negative for blurred vision, double vision, photophobia, pain, discharge and redness.  Respiratory: Negative for cough, hemoptysis, sputum production, shortness of breath, wheezing and stridor.   Cardiovascular: Negative for chest pain, palpitations, orthopnea, claudication, leg swelling and PND.    Gastrointestinal: Negative for heartburn, nausea, vomiting, abdominal pain, diarrhea, constipation, blood in stool and melena.  Genitourinary: Positive for frequency. Negative for dysuria, urgency, hematuria and flank pain.       Incontinent of bladder  Musculoskeletal: Positive for back pain. Negative for myalgias, joint pain, falls and neck pain.       W/c for mobility.   Skin: Negative for itching and rash.  Neurological: Negative for dizziness, tingling, tremors, sensory change, speech change, focal weakness, seizures, weakness and headaches.  Endo/Heme/Allergies: Negative for environmental allergies and polydipsia. Does not bruise/bleed easily.  Psychiatric/Behavioral: Positive for depression and memory loss. Negative for suicidal ideas, hallucinations and substance abuse. The patient is nervous/anxious. The patient does not have insomnia.      Past Medical History  Diagnosis Date  . Depression    History reviewed. No pertinent past surgical history. Social History:   reports that she has quit smoking. Her smoking use included Cigarettes. She quit after 30 years of use. She does not have any smokeless tobacco history on file. She reports that she does not drink alcohol or use illicit drugs.  Family History  Problem Relation Age of Onset  . Stroke Mother   . Dementia Mother   . Alcohol abuse Father   . Arthritis Sister   . Arthritis Sister     Medications: Patient's Medications  New Prescriptions  No medications on file  Previous Medications   ACETAMINOPHEN (TYLENOL) 650 MG SUPPOSITORY    Place 650 mg rectally 3 (three) times daily.   DIVALPROEX (DEPAKOTE) 250 MG DR TABLET    Take 250 mg by mouth 2 (two) times daily.   FAMOTIDINE (PEPCID) 20 MG TABLET    Take 20 mg by mouth daily. Take 1 tablet daily.   POLYETHYLENE GLYCOL (MIRALAX / GLYCOLAX) PACKET    Take 17 g by mouth daily. Fill cap to 17 gm mark, mix with 4 ounces of fluid and take by mouth daily.    SENNOSIDES-DOCUSATE SODIUM (SENOKOT-S) 8.6-50 MG TABLET    Take 2 tablets by mouth daily.  Modified Medications   No medications on file  Discontinued Medications   No medications on file     Physical Exam: Physical Exam  Constitutional: She is oriented to person, place, and time. She appears well-developed and well-nourished. No distress.  HENT:  Head: Normocephalic and atraumatic.  Eyes: Conjunctivae and EOM are normal. Pupils are equal, round, and reactive to light.  Neck: Normal range of motion. Neck supple. No JVD present. No thyromegaly present.  Cardiovascular: Normal rate and regular rhythm.   No murmur heard. Pulmonary/Chest: Effort normal and breath sounds normal. She has no wheezes. She has no rales.  Abdominal: Soft. Bowel sounds are normal. There is no tenderness.  Musculoskeletal: Normal range of motion. She exhibits no edema or tenderness.  W/c dependent. Chronic back pain is well managed with Tylenol 650mg  tid.   Lymphadenopathy:    She has no cervical adenopathy.  Neurological: She is alert and oriented to person, place, and time. She displays normal reflexes. No cranial nerve deficit. She exhibits normal muscle tone. Coordination normal.  Skin: Skin is warm and dry. No rash noted. She is not diaphoretic.  Psychiatric: Thought content normal. Her mood appears anxious. Her affect is inappropriate. Her speech is tangential. She is agitated and combative. Cognition and memory are impaired. She expresses impulsivity and inappropriate judgment. She exhibits a depressed mood. She exhibits abnormal recent memory and abnormal remote memory.  Restlessness    Filed Vitals:   10/22/14 1539  BP: 120/72  Pulse: 80  Temp: 98.3 F (36.8 C)  TempSrc: Tympanic  Resp: 20      Labs reviewed: Basic Metabolic Panel: No results for input(s): NA, K, CL, CO2, GLUCOSE, BUN, CREATININE, CALCIUM, MG, PHOS, TSH in the last 8760 hours. Liver Function Tests: No results for input(s):  AST, ALT, ALKPHOS, BILITOT, PROT, ALBUMIN in the last 8760 hours. No results for input(s): LIPASE, AMYLASE in the last 8760 hours. No results for input(s): AMMONIA in the last 8760 hours. CBC: No results for input(s): WBC, NEUTROABS, HGB, HCT, MCV, PLT in the last 8760 hours. Lipid Panel: No results for input(s): CHOL, HDL, LDLCALC, TRIG, CHOLHDL, LDLDIRECT in the last 8760 hours.  Past Procedures:  NA  Assessment/Plan Constipation Stable on MiraLax qod and Senokot S II qhs.      Dementia in chronic affective disorder 04/02/14 Depakote 250mg  bid 04/07/14 hold Buspirone, Depakote, Tramadol(dc'd Buspirone and changed Tramadol prn subsequently) 07/24/14 stable, takes Depakote 250mg  bid.  09/23/14 no change.      Esophageal reflux Stable, takes Famotidine 20mg     Hx of seizure disorder No active seizures since last seen. Continue Depakote.    Essential hypertension Controlled.    Lower back pain Controlled, takesTylenl 650mg  tid andTramadol 50mg  prn    Major depressive disorder, single episode Restlessness, mood is stabilized, continue  Depakote.        Family/ Staff Communication: observe the patient  Goals of Care: SNF  Labs/tests ordered: none

## 2014-10-22 NOTE — Assessment & Plan Note (Signed)
No active seizures since last seen. Continue Depakote.

## 2014-11-19 ENCOUNTER — Non-Acute Institutional Stay (SKILLED_NURSING_FACILITY): Payer: Medicare Other | Admitting: Nurse Practitioner

## 2014-11-19 ENCOUNTER — Encounter: Payer: Self-pay | Admitting: Nurse Practitioner

## 2014-11-19 DIAGNOSIS — F324 Major depressive disorder, single episode, in partial remission: Secondary | ICD-10-CM

## 2014-11-19 DIAGNOSIS — K5900 Constipation, unspecified: Secondary | ICD-10-CM

## 2014-11-19 DIAGNOSIS — M5440 Lumbago with sciatica, unspecified side: Secondary | ICD-10-CM

## 2014-11-19 DIAGNOSIS — I10 Essential (primary) hypertension: Secondary | ICD-10-CM

## 2014-11-19 DIAGNOSIS — K219 Gastro-esophageal reflux disease without esophagitis: Secondary | ICD-10-CM

## 2014-11-19 DIAGNOSIS — F0280 Dementia in other diseases classified elsewhere without behavioral disturbance: Secondary | ICD-10-CM

## 2014-11-19 DIAGNOSIS — F39 Unspecified mood [affective] disorder: Secondary | ICD-10-CM

## 2014-11-19 DIAGNOSIS — Z8669 Personal history of other diseases of the nervous system and sense organs: Secondary | ICD-10-CM | POA: Diagnosis not present

## 2014-11-19 NOTE — Assessment & Plan Note (Signed)
Restlessness, mood is stabilized, continue Depakote.

## 2014-11-19 NOTE — Assessment & Plan Note (Signed)
Controlled.  

## 2014-11-19 NOTE — Assessment & Plan Note (Signed)
Controlled, takesTylenl 650mg  tid

## 2014-11-19 NOTE — Assessment & Plan Note (Signed)
Stable, takes Famotidine 20mg 

## 2014-11-19 NOTE — Assessment & Plan Note (Signed)
No active seizures since last seen. Continue Depakote.

## 2014-11-19 NOTE — Assessment & Plan Note (Signed)
Stable on MiraLax qod and Senokot S II qhs.

## 2014-11-19 NOTE — Progress Notes (Signed)
Patient ID: Veronica Sanchez, female   DOB: 1924/04/17, 81 y.o.   MRN: [id number]   Code Status: DNR  Allergies  Allergen Reactions  . Amoxicillin     REACTION: unspecified  . Celebrex [Celecoxib]   . Codeine Sulfate     REACTION: unspecified  . Naproxen Sodium     REACTION: unspecified  . Reminyl [Galantamine Hydrobromide]   . Versed [Midazolam]   . Vioxx [Rofecoxib]     Chief Complaint  Patient presents with  . Medical Management of Chronic Issues    HPI: Patient is a 81 y.o. female seen in the SNF at [facility] today for evaluation of chronic medical conditions.  Problem List Items Addressed This Visit    Major depressive disorder, single episode    Restlessness, mood is stabilized, continue Depakote.         Lower back pain    Controlled, takesTylenl 650mg  tid        Hx of seizure disorder    No active seizures since last seen. Continue Depakote.        Essential hypertension    Controlled.        Esophageal reflux    Stable, takes Famotidine 20mg         Dementia in chronic affective disorder - Primary    04/02/14 Depakote 250mg  bid 04/07/14 hold Buspirone, Depakote, Tramadol(dc'd Buspirone and changed Tramadol prn subsequently) 07/24/14 stable, takes Depakote 250mg  bid.  --no change.          Constipation    Stable on MiraLax qod and Senokot S II qhs.            Review of Systems:  Review of Systems  Constitutional: Negative for fever, chills, weight loss, malaise/fatigue and diaphoresis.  HENT: Positive for hearing loss. Negative for congestion, ear discharge, ear pain, nosebleeds, sore throat and tinnitus.   Eyes: Negative for blurred vision, double vision, photophobia, pain, discharge and redness.  Respiratory: Negative for cough, hemoptysis, sputum production, shortness of breath, wheezing and stridor.   Cardiovascular: Negative for chest pain, palpitations, orthopnea, claudication, leg swelling and PND.  Gastrointestinal:  Negative for heartburn, nausea, vomiting, abdominal pain, diarrhea, constipation, blood in stool and melena.  Genitourinary: Positive for frequency. Negative for dysuria, urgency, hematuria and flank pain.       Urinary incontinence  Musculoskeletal: Positive for back pain. Negative for myalgias, joint pain, falls and neck pain.       W/c dependent  Skin: Negative for itching and rash.  Neurological: Negative for dizziness, tingling, tremors, sensory change, speech change, focal weakness, seizures, loss of consciousness, weakness and headaches.  Endo/Heme/Allergies: Negative for environmental allergies and polydipsia. Does not bruise/bleed easily.  Psychiatric/Behavioral: Positive for memory loss. Negative for depression, suicidal ideas, hallucinations and substance abuse. The patient is nervous/anxious. The patient does not have insomnia.      Past Medical History  Diagnosis Date  . Depression    History reviewed. No pertinent past surgical history. Social History:   reports that she has quit smoking. Her smoking use included Cigarettes. She quit after 30 years of use. She does not have any smokeless tobacco history on file. She reports that she does not drink alcohol or use illicit drugs.  Family History  Problem Relation Age of Onset  . Stroke Mother   . Dementia Mother   . Alcohol abuse Father   . Arthritis Sister   . Arthritis Sister     Medications: Patient's Medications  New Prescriptions  No medications on file  Previous Medications   ACETAMINOPHEN (TYLENOL) 650 MG SUPPOSITORY    Place 650 mg rectally 3 (three) times daily.   DIVALPROEX (DEPAKOTE) 250 MG DR TABLET    Take 250 mg by mouth 2 (two) times daily.   FAMOTIDINE (PEPCID) 20 MG TABLET    Take 20 mg by mouth daily. Take 1 tablet daily.   POLYETHYLENE GLYCOL (MIRALAX / GLYCOLAX) PACKET    Take 17 g by mouth daily. Fill cap to 17 gm mark, mix with 4 ounces of fluid and take by mouth daily.   SENNOSIDES-DOCUSATE  SODIUM (SENOKOT-S) 8.6-50 MG TABLET    Take 2 tablets by mouth daily.  Modified Medications   No medications on file  Discontinued Medications   No medications on file     Physical Exam: Physical Exam  Constitutional: She is oriented to person, place, and time. She appears well-developed and well-nourished. No distress.  HENT:  Head: Normocephalic and atraumatic.  Eyes: Conjunctivae and EOM are normal. Pupils are equal, round, and reactive to light.  Neck: Normal range of motion. Neck supple. No JVD present. No thyromegaly present.  Cardiovascular: Normal rate and regular rhythm.   No murmur heard. Pulmonary/Chest: Effort normal and breath sounds normal. She has no wheezes. She has no rales.  Abdominal: Soft. Bowel sounds are normal. There is no tenderness.  Musculoskeletal: Normal range of motion. She exhibits no edema or tenderness.  W/c dependent. Chronic back pain is well managed with Tylenol 650mg  tid.   Lymphadenopathy:    She has no cervical adenopathy.  Neurological: She is alert and oriented to person, place, and time. She displays normal reflexes. No cranial nerve deficit. She exhibits normal muscle tone. Coordination normal.  Skin: Skin is warm and dry. No rash noted. She is not diaphoretic.  Psychiatric: Thought content normal. Her mood appears anxious. Her affect is inappropriate. Her speech is tangential. She is agitated and combative. Cognition and memory are impaired. She expresses impulsivity and inappropriate judgment. She exhibits a depressed mood. She exhibits abnormal recent memory and abnormal remote memory.  Restlessness    Filed Vitals:   11/24/14 1108  BP: 122/80  Pulse: 86  Temp: 98.3 F (36.8 C)  TempSrc: Tympanic  Resp: 16      Labs reviewed: Basic Metabolic Panel: No results for input(s): NA, K, CL, CO2, GLUCOSE, BUN, CREATININE, CALCIUM, MG, PHOS, TSH in the last 8760 hours. Liver Function Tests: No results for input(s): AST, ALT, ALKPHOS,  BILITOT, PROT, ALBUMIN in the last 8760 hours. No results for input(s): LIPASE, AMYLASE in the last 8760 hours. No results for input(s): AMMONIA in the last 8760 hours. CBC: No results for input(s): WBC, NEUTROABS, HGB, HCT, MCV, PLT in the last 8760 hours. Lipid Panel: No results for input(s): CHOL, HDL, LDLCALC, TRIG, CHOLHDL, LDLDIRECT in the last 8760 hours.  Past Procedures:  NA  Assessment/Plan Constipation Stable on MiraLax qod and Senokot S II qhs.      Esophageal reflux Stable, takes Famotidine 20mg      Dementia in chronic affective disorder 04/02/14 Depakote 250mg  bid 04/07/14 hold Buspirone, Depakote, Tramadol(dc'd Buspirone and changed Tramadol prn subsequently) 07/24/14 stable, takes Depakote 250mg  bid.  --no change.       Hx of seizure disorder No active seizures since last seen. Continue Depakote.     Lower back pain Controlled, takesTylenl 650mg  tid     Essential hypertension Controlled.     Major depressive disorder, single episode Restlessness, mood is stabilized,  continue Depakote.        Family/ Staff Communication: observe the patient  Goals of Care: SNF  Labs/tests ordered: none

## 2014-11-19 NOTE — Assessment & Plan Note (Addendum)
04/02/14 Depakote 250mg  bid 04/07/14 hold Buspirone, Depakote, Tramadol(dc'd Buspirone and changed Tramadol prn subsequently) 07/24/14 stable, takes Depakote 250mg  bid.  --no change.

## 2014-12-10 ENCOUNTER — Encounter: Payer: Self-pay | Admitting: Nurse Practitioner

## 2014-12-10 ENCOUNTER — Non-Acute Institutional Stay (SKILLED_NURSING_FACILITY): Payer: Medicare Other | Admitting: Nurse Practitioner

## 2014-12-10 DIAGNOSIS — I10 Essential (primary) hypertension: Secondary | ICD-10-CM | POA: Diagnosis not present

## 2014-12-10 DIAGNOSIS — F39 Unspecified mood [affective] disorder: Secondary | ICD-10-CM

## 2014-12-10 DIAGNOSIS — K219 Gastro-esophageal reflux disease without esophagitis: Secondary | ICD-10-CM

## 2014-12-10 DIAGNOSIS — F0280 Dementia in other diseases classified elsewhere without behavioral disturbance: Secondary | ICD-10-CM

## 2014-12-10 DIAGNOSIS — Z8669 Personal history of other diseases of the nervous system and sense organs: Secondary | ICD-10-CM | POA: Diagnosis not present

## 2014-12-10 DIAGNOSIS — F324 Major depressive disorder, single episode, in partial remission: Secondary | ICD-10-CM | POA: Diagnosis not present

## 2014-12-10 DIAGNOSIS — M5440 Lumbago with sciatica, unspecified side: Secondary | ICD-10-CM | POA: Diagnosis not present

## 2014-12-10 DIAGNOSIS — K5900 Constipation, unspecified: Secondary | ICD-10-CM | POA: Diagnosis not present

## 2014-12-10 NOTE — Assessment & Plan Note (Signed)
MMSE 9/30 02/07/14 Off Namenda and Aricept 02/26/14 04/02/14 Depakote 250mg  bid 04/07/14 hold Buspirone, Depakote, Tramadol(dc'd Buspirone and changed Tramadol prn subsequently) 12/08/14 reduce Depakote from 250mg  bid to 125mg  am and 250mg  pm.

## 2014-12-10 NOTE — Assessment & Plan Note (Signed)
Controlled, takesTylenl 650mg  tid

## 2014-12-10 NOTE — Progress Notes (Signed)
Patient ID: Veronica Sanchez, female   DOB: Dec 30, 1923, 81 y.o.   MRN: [id number]   Code Status: DNR  Allergies  Allergen Reactions  . Amoxicillin     REACTION: unspecified  . Celebrex [Celecoxib]   . Codeine Sulfate     REACTION: unspecified  . Naproxen Sodium     REACTION: unspecified  . Reminyl [Galantamine Hydrobromide]   . Versed [Midazolam]   . Vioxx [Rofecoxib]     Chief Complaint  Patient presents with  . Medical Management of Chronic Issues    HPI: Patient is a 81 y.o. female seen in the SNF at [facility] today for evaluation of chronic medical conditions.  Problem List Items Addressed This Visit    Dementia in chronic affective disorder - Primary    MMSE 9/30 02/07/14 Off Namenda and Aricept 02/26/14 04/02/14 Depakote 250mg  bid 04/07/14 hold Buspirone, Depakote, Tramadol(dc'd Buspirone and changed Tramadol prn subsequently) 12/08/14 reduce Depakote from 250mg  bid to 125mg  am and 250mg  pm.        Essential hypertension    Controlled.        Lower back pain    Controlled, takesTylenl 650mg  tid        Major depressive disorder, single episode    Restlessness, mood is stabilized, continue Depakote.        Esophageal reflux    Stable, takes Famotidine 20mg        Constipation    Stable on MiraLax qod and Senokot S II qhs.        Hx of seizure disorder    No active seizures since last seen. Continue Depakote.            Review of Systems:  Review of Systems  Constitutional: Negative for fever, chills and diaphoresis.  HENT: Positive for hearing loss. Negative for congestion, ear discharge, ear pain, nosebleeds, sore throat and tinnitus.   Eyes: Negative for photophobia, pain, discharge and redness.  Respiratory: Negative for cough, shortness of breath, wheezing and stridor.   Cardiovascular: Negative for chest pain, palpitations and leg swelling.  Gastrointestinal: Negative for nausea, vomiting, abdominal pain, diarrhea, constipation and  blood in stool.  Endocrine: Negative for polydipsia.  Genitourinary: Positive for frequency. Negative for dysuria, urgency, hematuria and flank pain.       Urinary incontinence  Musculoskeletal: Positive for back pain. Negative for myalgias and neck pain.       W/c dependent  Skin: Negative for rash.  Allergic/Immunologic: Negative for environmental allergies.  Neurological: Negative for dizziness, tremors, seizures, weakness and headaches.  Hematological: Does not bruise/bleed easily.  Psychiatric/Behavioral: Negative for suicidal ideas and hallucinations. The patient is nervous/anxious.      Past Medical History  Diagnosis Date  . Depression    History reviewed. No pertinent past surgical history. Social History:   reports that she has quit smoking. Her smoking use included Cigarettes. She quit after 30 years of use. She does not have any smokeless tobacco history on file. She reports that she does not drink alcohol or use illicit drugs.  Family History  Problem Relation Age of Onset  . Stroke Mother   . Dementia Mother   . Alcohol abuse Father   . Arthritis Sister   . Arthritis Sister     Medications: Patient's Medications  New Prescriptions   No medications on file  Previous Medications   ACETAMINOPHEN (TYLENOL) 650 MG SUPPOSITORY    Place 650 mg rectally 3 (three) times daily.   DIVALPROEX (DEPAKOTE) 250  MG DR TABLET    Take 250 mg by mouth 2 (two) times daily.   FAMOTIDINE (PEPCID) 20 MG TABLET    Take 20 mg by mouth daily. Take 1 tablet daily.   POLYETHYLENE GLYCOL (MIRALAX / GLYCOLAX) PACKET    Take 17 g by mouth daily. Fill cap to 17 gm mark, mix with 4 ounces of fluid and take by mouth daily.   SENNOSIDES-DOCUSATE SODIUM (SENOKOT-S) 8.6-50 MG TABLET    Take 2 tablets by mouth daily.  Modified Medications   No medications on file  Discontinued Medications   No medications on file     Physical Exam: Physical Exam  Constitutional: She is oriented to person,  place, and time. She appears well-developed and well-nourished. No distress.  HENT:  Head: Normocephalic and atraumatic.  Eyes: Conjunctivae and EOM are normal. Pupils are equal, round, and reactive to light.  Neck: Normal range of motion. Neck supple. No JVD present. No thyromegaly present.  Cardiovascular: Normal rate and regular rhythm.   No murmur heard. Pulmonary/Chest: Effort normal and breath sounds normal. She has no wheezes. She has no rales.  Abdominal: Soft. Bowel sounds are normal. There is no tenderness.  Musculoskeletal: Normal range of motion. She exhibits no edema or tenderness.  W/c dependent. Chronic back pain is well managed with Tylenol 650mg  tid.   Lymphadenopathy:    She has no cervical adenopathy.  Neurological: She is alert and oriented to person, place, and time. She displays normal reflexes. No cranial nerve deficit. She exhibits normal muscle tone. Coordination normal.  Skin: Skin is warm and dry. No rash noted. She is not diaphoretic.  Psychiatric: Thought content normal. Her mood appears anxious. Her affect is inappropriate. Her speech is tangential. She is agitated and combative. Cognition and memory are impaired. She expresses impulsivity and inappropriate judgment. She exhibits a depressed mood. She exhibits abnormal recent memory and abnormal remote memory.  Restlessness    Filed Vitals:   12/10/14 1617  BP: 120/70  Pulse: 68  Temp: 98 F (36.7 C)  TempSrc: Tympanic  Resp: 20      Labs reviewed: Basic Metabolic Panel: No results for input(s): NA, K, CL, CO2, GLUCOSE, BUN, CREATININE, CALCIUM, MG, PHOS, TSH in the last 8760 hours. Liver Function Tests: No results for input(s): AST, ALT, ALKPHOS, BILITOT, PROT, ALBUMIN in the last 8760 hours. No results for input(s): LIPASE, AMYLASE in the last 8760 hours. No results for input(s): AMMONIA in the last 8760 hours. CBC: No results for input(s): WBC, NEUTROABS, HGB, HCT, MCV, PLT in the last 8760  hours. Lipid Panel: No results for input(s): CHOL, HDL, LDLCALC, TRIG, CHOLHDL, LDLDIRECT in the last 8760 hours.  Past Procedures:  None recently.   Assessment/Plan Dementia in chronic affective disorder MMSE 9/30 02/07/14 Off Namenda and Aricept 02/26/14 04/02/14 Depakote 250mg  bid 04/07/14 hold Buspirone, Depakote, Tramadol(dc'd Buspirone and changed Tramadol prn subsequently) 12/08/14 reduce Depakote from 250mg  bid to 125mg  am and 250mg  pm.     Essential hypertension Controlled.     Hx of seizure disorder No active seizures since last seen. Continue Depakote.      Constipation Stable on MiraLax qod and Senokot S II qhs.     Esophageal reflux Stable, takes Famotidine 20mg     Lower back pain Controlled, takesTylenl 650mg  tid     Major depressive disorder, single episode Restlessness, mood is stabilized, continue Depakote.       Family/ Staff Communication: observe the patient  Goals of Care: SNF  Labs/tests ordered: none

## 2014-12-10 NOTE — Assessment & Plan Note (Signed)
No active seizures since last seen. Continue Depakote.

## 2014-12-10 NOTE — Assessment & Plan Note (Signed)
Stable on MiraLax qod and Senokot S II qhs.

## 2014-12-10 NOTE — Assessment & Plan Note (Signed)
Controlled.  

## 2014-12-10 NOTE — Assessment & Plan Note (Signed)
Stable, takes Famotidine 20mg 

## 2014-12-10 NOTE — Assessment & Plan Note (Signed)
Restlessness, mood is stabilized, continue Depakote.

## 2015-01-07 ENCOUNTER — Non-Acute Institutional Stay (SKILLED_NURSING_FACILITY): Payer: Medicare Other | Admitting: Nurse Practitioner

## 2015-01-07 ENCOUNTER — Encounter: Payer: Self-pay | Admitting: Nurse Practitioner

## 2015-01-07 DIAGNOSIS — M5440 Lumbago with sciatica, unspecified side: Secondary | ICD-10-CM | POA: Diagnosis not present

## 2015-01-07 DIAGNOSIS — F0280 Dementia in other diseases classified elsewhere without behavioral disturbance: Secondary | ICD-10-CM | POA: Diagnosis not present

## 2015-01-07 DIAGNOSIS — K5900 Constipation, unspecified: Secondary | ICD-10-CM | POA: Diagnosis not present

## 2015-01-07 DIAGNOSIS — K219 Gastro-esophageal reflux disease without esophagitis: Secondary | ICD-10-CM | POA: Diagnosis not present

## 2015-01-07 DIAGNOSIS — I10 Essential (primary) hypertension: Secondary | ICD-10-CM

## 2015-01-07 DIAGNOSIS — F39 Unspecified mood [affective] disorder: Secondary | ICD-10-CM

## 2015-01-07 DIAGNOSIS — F321 Major depressive disorder, single episode, moderate: Secondary | ICD-10-CM | POA: Diagnosis not present

## 2015-01-07 NOTE — Assessment & Plan Note (Signed)
Stable on MiraLax qod and Senokot S II qhs.

## 2015-01-07 NOTE — Assessment & Plan Note (Signed)
Restlessness, mood is stabilized, continue Depakote.

## 2015-01-07 NOTE — Assessment & Plan Note (Signed)
Controlled. Not taking antihypertensive agents.

## 2015-01-07 NOTE — Progress Notes (Signed)
Patient ID: Veronica Sanchez, female   DOB: [birth year], 81 y.o.   MRN: [id number]   Code Status: DNR  Allergies  Allergen Reactions  . Amoxicillin     REACTION: unspecified  . Celebrex [Celecoxib]   . Codeine Sulfate     REACTION: unspecified  . Naproxen Sodium     REACTION: unspecified  . Reminyl [Galantamine Hydrobromide]   . Versed [Midazolam]   . Vioxx [Rofecoxib]     Chief Complaint  Patient presents with  . Medical Management of Chronic Issues    HPI: Patient is a 81 y.o. female seen in the SNF at [facility] today for evaluation of chronic medical conditions.  Problem List Items Addressed This Visit    Constipation - Primary    Stable on MiraLax qod and Senokot S II qhs.       Dementia in chronic affective disorder    MMSE 9/30 02/07/14 Off Namenda and Aricept 02/26/14 04/02/14 Depakote 250mg  bid 04/07/14 hold Buspirone, Depakote, Tramadol(dc'd Buspirone and changed Tramadol prn subsequently) 12/08/14 reduce Depakote from 250mg  bid to 125mg  am and 250mg  pm.  01/07/15 stable, continue Depakote 125mg  am and 250mg  pm       Esophageal reflux    Stable, takes Famotidine 20mg         Essential hypertension    Controlled. Not taking antihypertensive agents.        Lower back pain    Controlled, takesTylenl 650mg  tid        Major depressive disorder, single episode    Restlessness, mood is stabilized, continue Depakote.           Review of Systems:  Review of Systems  Constitutional: Negative for fever, chills and diaphoresis.  HENT: Positive for hearing loss. Negative for congestion, ear discharge, ear pain, nosebleeds, sore throat and tinnitus.   Eyes: Negative for photophobia, pain, discharge and redness.  Respiratory: Negative for cough, shortness of breath, wheezing and stridor.   Cardiovascular: Negative for chest pain, palpitations and leg swelling.  Gastrointestinal: Negative for nausea, vomiting, abdominal pain, diarrhea, constipation and  blood in stool.  Endocrine: Negative for polydipsia.  Genitourinary: Positive for frequency. Negative for dysuria, urgency, hematuria and flank pain.       Urinary incontinence  Musculoskeletal: Positive for back pain. Negative for myalgias and neck pain.       W/c dependent  Skin: Negative for rash.  Allergic/Immunologic: Negative for environmental allergies.  Neurological: Negative for dizziness, tremors, seizures, weakness and headaches.  Hematological: Does not bruise/bleed easily.  Psychiatric/Behavioral: Negative for suicidal ideas and hallucinations. The patient is nervous/anxious.      Past Medical History  Diagnosis Date  . Depression    History reviewed. No pertinent past surgical history. Social History:   reports that she has quit smoking. Her smoking use included Cigarettes. She quit after 30 years of use. She does not have any smokeless tobacco history on file. She reports that she does not drink alcohol or use illicit drugs.  Family History  Problem Relation Age of Onset  . Stroke Mother   . Dementia Mother   . Alcohol abuse Father   . Arthritis Sister   . Arthritis Sister     Medications: Patient's Medications  New Prescriptions   No medications on file  Previous Medications   ACETAMINOPHEN (TYLENOL) 650 MG SUPPOSITORY    Place 650 mg rectally 3 (three) times daily.   DIVALPROEX (DEPAKOTE) 250 MG DR TABLET    Take 250 mg by  mouth 2 (two) times daily.   FAMOTIDINE (PEPCID) 20 MG TABLET    Take 20 mg by mouth daily. Take 1 tablet daily.   POLYETHYLENE GLYCOL (MIRALAX / GLYCOLAX) PACKET    Take 17 g by mouth daily. Fill cap to 17 gm mark, mix with 4 ounces of fluid and take by mouth daily.   SENNOSIDES-DOCUSATE SODIUM (SENOKOT-S) 8.6-50 MG TABLET    Take 2 tablets by mouth daily.  Modified Medications   No medications on file  Discontinued Medications   No medications on file     Physical Exam: Physical Exam  Constitutional: She is oriented to person,  place, and time. She appears well-developed and well-nourished. No distress.  HENT:  Head: Normocephalic and atraumatic.  Eyes: Conjunctivae and EOM are normal. Pupils are equal, round, and reactive to light.  Neck: Normal range of motion. Neck supple. No JVD present. No thyromegaly present.  Cardiovascular: Normal rate and regular rhythm.   No murmur heard. Pulmonary/Chest: Effort normal and breath sounds normal. She has no wheezes. She has no rales.  Abdominal: Soft. Bowel sounds are normal. There is no tenderness.  Musculoskeletal: Normal range of motion. She exhibits no edema or tenderness.  W/c dependent. Chronic back pain is well managed with Tylenol 650mg  tid.   Lymphadenopathy:    She has no cervical adenopathy.  Neurological: She is alert and oriented to person, place, and time. She displays normal reflexes. No cranial nerve deficit. She exhibits normal muscle tone. Coordination normal.  Skin: Skin is warm and dry. No rash noted. She is not diaphoretic.  Psychiatric: Thought content normal. Her mood appears anxious. Her affect is inappropriate. Her speech is tangential. She is agitated and combative. Cognition and memory are impaired. She expresses impulsivity and inappropriate judgment. She exhibits a depressed mood. She exhibits abnormal recent memory and abnormal remote memory.  Restlessness    Filed Vitals:   01/07/15 1529  BP: 110/70  Pulse: 80  Temp: 97.7 F (36.5 C)  TempSrc: Tympanic  Resp: 18      Labs reviewed: Basic Metabolic Panel: No results for input(s): NA, K, CL, CO2, GLUCOSE, BUN, CREATININE, CALCIUM, MG, PHOS, TSH in the last 8760 hours. Liver Function Tests: No results for input(s): AST, ALT, ALKPHOS, BILITOT, PROT, ALBUMIN in the last 8760 hours. No results for input(s): LIPASE, AMYLASE in the last 8760 hours. No results for input(s): AMMONIA in the last 8760 hours. CBC: No results for input(s): WBC, NEUTROABS, HGB, HCT, MCV, PLT in the last 8760  hours. Lipid Panel: No results for input(s): CHOL, HDL, LDLCALC, TRIG, CHOLHDL, LDLDIRECT in the last 8760 hours.  Past Procedures:  None recently.   Assessment/Plan Constipation Stable on MiraLax qod and Senokot S II qhs.    Dementia in chronic affective disorder MMSE 9/30 02/07/14 Off Namenda and Aricept 02/26/14 04/02/14 Depakote 250mg  bid 04/07/14 hold Buspirone, Depakote, Tramadol(dc'd Buspirone and changed Tramadol prn subsequently) 12/08/14 reduce Depakote from 250mg  bid to 125mg  am and 250mg  pm.  01/07/15 stable, continue Depakote 125mg  am and 250mg  pm    Esophageal reflux Stable, takes Famotidine 20mg      Lower back pain Controlled, takesTylenl 650mg  tid     Major depressive disorder, single episode Restlessness, mood is stabilized, continue Depakote.     Essential hypertension Controlled. Not taking antihypertensive agents.       Family/ Staff Communication: observe the patient  Goals of Care: SNF  Labs/tests ordered: none

## 2015-01-07 NOTE — Assessment & Plan Note (Signed)
Stable, takes Famotidine 20mg 

## 2015-01-07 NOTE — Assessment & Plan Note (Signed)
Controlled, takesTylenl 650mg  tid

## 2015-01-07 NOTE — Assessment & Plan Note (Signed)
MMSE 9/30 02/07/14 Off Namenda and Aricept 02/26/14 04/02/14 Depakote 250mg  bid 04/07/14 hold Buspirone, Depakote, Tramadol(dc'd Buspirone and changed Tramadol prn subsequently) 12/08/14 reduce Depakote from 250mg  bid to 125mg  am and 250mg  pm.  01/07/15 stable, continue Depakote 125mg  am and 250mg  pm

## 2015-02-09 ENCOUNTER — Encounter: Payer: Self-pay | Admitting: Nurse Practitioner

## 2015-02-09 ENCOUNTER — Non-Acute Institutional Stay (SKILLED_NURSING_FACILITY): Payer: Medicare Other | Admitting: Nurse Practitioner

## 2015-02-09 DIAGNOSIS — J209 Acute bronchitis, unspecified: Secondary | ICD-10-CM

## 2015-02-09 DIAGNOSIS — I10 Essential (primary) hypertension: Secondary | ICD-10-CM | POA: Diagnosis not present

## 2015-02-09 DIAGNOSIS — M5440 Lumbago with sciatica, unspecified side: Secondary | ICD-10-CM | POA: Diagnosis not present

## 2015-02-09 DIAGNOSIS — F39 Unspecified mood [affective] disorder: Secondary | ICD-10-CM | POA: Diagnosis not present

## 2015-02-09 DIAGNOSIS — F0280 Dementia in other diseases classified elsewhere without behavioral disturbance: Secondary | ICD-10-CM | POA: Diagnosis not present

## 2015-02-09 DIAGNOSIS — K5900 Constipation, unspecified: Secondary | ICD-10-CM | POA: Diagnosis not present

## 2015-02-09 DIAGNOSIS — F324 Major depressive disorder, single episode, in partial remission: Secondary | ICD-10-CM | POA: Diagnosis not present

## 2015-02-09 DIAGNOSIS — K219 Gastro-esophageal reflux disease without esophagitis: Secondary | ICD-10-CM | POA: Diagnosis not present

## 2015-02-09 DIAGNOSIS — Z8669 Personal history of other diseases of the nervous system and sense organs: Secondary | ICD-10-CM

## 2015-02-09 NOTE — Assessment & Plan Note (Signed)
Stable on MiraLax qod and Senokot S II qhs.

## 2015-02-09 NOTE — Assessment & Plan Note (Signed)
Restlessness, mood is stabilized, continue Depakote.

## 2015-02-09 NOTE — Assessment & Plan Note (Signed)
Controlled. Not taking antihypertensive agents.

## 2015-02-09 NOTE — Assessment & Plan Note (Signed)
Onset 02/06/15 T 101.7, productive cough, 7 day course Avelox 400mg  daily started. Afebrile today. Persisted congestive cough presented upon my visit. Will add Neb q6hr x 48 hrs then prn x 1 wk then dc. Prednisone taper dose: 40mg  daily x 3 days, then 20mg  daily x 3 days, then 5mg  daiy x 3 days then dc. Observe the patient. Hospice consult delayed her admission to Hospice service

## 2015-02-09 NOTE — Assessment & Plan Note (Signed)
01/12/15 dc am Depakote and continue 250mg  pm.

## 2015-02-09 NOTE — Assessment & Plan Note (Signed)
No active seizures since last seen. Continue Depakote.

## 2015-02-09 NOTE — Assessment & Plan Note (Signed)
Stable, takes Famotidine 20mg 

## 2015-02-09 NOTE — Progress Notes (Signed)
Patient ID: Veronica Sanchez, female   DOB: August 07, 1924, 81 y.o.   MRN: [id number]   Code Status: DNR  Allergies  Allergen Reactions  . Amoxicillin     REACTION: unspecified  . Celebrex [Celecoxib]   . Codeine Sulfate     REACTION: unspecified  . Naproxen Sodium     REACTION: unspecified  . Reminyl [Galantamine Hydrobromide]   . Versed [Midazolam]   . Vioxx [Rofecoxib]     Chief Complaint  Patient presents with  . Medical Management of Chronic Issues  . Acute Visit    congested cough    HPI: Patient is a 81 y.o. female seen in the SNF at [facility] today for evaluation of acute congestive cough chronic medical conditions.  Problem List Items Addressed This Visit    Dementia in chronic affective disorder    01/12/15 dc am Depakote and continue 250mg  pm.       Essential hypertension    Controlled. Not taking antihypertensive agents.        Lower back pain    Controlled, takesTylenl 650mg  tid         Major depressive disorder, single episode    Restlessness, mood is stabilized, continue Depakote.         Esophageal reflux    Stable, takes Famotidine 20mg        Constipation    Stable on MiraLax qod and Senokot S II qhs.        Hx of seizure disorder    No active seizures since last seen. Continue Depakote.         Acute bronchitis - Primary    Onset 02/06/15 T 101.7, productive cough, 7 day course Avelox 400mg  daily started. Afebrile today. Persisted congestive cough presented upon my visit. Will add Neb q6hr x 48 hrs then prn x 1 wk then dc. Prednisone taper dose: 40mg  daily x 3 days, then 20mg  daily x 3 days, then 5mg  daiy x 3 days then dc. Observe the patient. Hospice consult delayed her admission to Hospice service         Review of Systems:  Review of Systems  Constitutional: Negative for fever, chills and diaphoresis.  HENT: Positive for hearing loss. Negative for congestion, ear discharge, ear pain, nosebleeds, sore throat and  tinnitus.   Eyes: Negative for photophobia, pain, discharge and redness.  Respiratory: Negative for cough, shortness of breath, wheezing and stridor.   Cardiovascular: Negative for chest pain, palpitations and leg swelling.  Gastrointestinal: Negative for nausea, vomiting, abdominal pain, diarrhea, constipation and blood in stool.  Endocrine: Negative for polydipsia.  Genitourinary: Positive for frequency. Negative for dysuria, urgency, hematuria and flank pain.       Urinary incontinence  Musculoskeletal: Positive for back pain. Negative for myalgias and neck pain.       W/c dependent  Skin: Negative for rash.  Allergic/Immunologic: Negative for environmental allergies.  Neurological: Negative for dizziness, tremors, seizures, weakness and headaches.  Hematological: Does not bruise/bleed easily.  Psychiatric/Behavioral: Negative for suicidal ideas and hallucinations. The patient is nervous/anxious.      Past Medical History  Diagnosis Date  . Depression    History reviewed. No pertinent past surgical history. Social History:   reports that she has quit smoking. Her smoking use included Cigarettes. She quit after 30 years of use. She does not have any smokeless tobacco history on file. She reports that she does not drink alcohol or use illicit drugs.  Family History  Problem Relation Age  of Onset  . Stroke Mother   . Dementia Mother   . Alcohol abuse Father   . Arthritis Sister   . Arthritis Sister     Medications: Patient's Medications  New Prescriptions   No medications on file  Previous Medications   ACETAMINOPHEN (TYLENOL) 650 MG SUPPOSITORY    Place 650 mg rectally 3 (three) times daily.   DIVALPROEX (DEPAKOTE) 250 MG DR TABLET    Take 250 mg by mouth 2 (two) times daily.   FAMOTIDINE (PEPCID) 20 MG TABLET    Take 20 mg by mouth daily. Take 1 tablet daily.   POLYETHYLENE GLYCOL (MIRALAX / GLYCOLAX) PACKET    Take 17 g by mouth daily. Fill cap to 17 gm mark, mix with 4  ounces of fluid and take by mouth daily.   SENNOSIDES-DOCUSATE SODIUM (SENOKOT-S) 8.6-50 MG TABLET    Take 2 tablets by mouth daily.  Modified Medications   No medications on file  Discontinued Medications   No medications on file     Physical Exam: Physical Exam  Constitutional: She is oriented to person, place, and time. She appears well-developed and well-nourished. No distress.  HENT:  Head: Normocephalic and atraumatic.  Eyes: Conjunctivae and EOM are normal. Pupils are equal, round, and reactive to light.  Neck: Normal range of motion. Neck supple. No JVD present. No thyromegaly present.  Cardiovascular: Normal rate and regular rhythm.   No murmur heard. Pulmonary/Chest: Effort normal and breath sounds normal. She has no wheezes. She has no rales.  Abdominal: Soft. Bowel sounds are normal. There is no tenderness.  Musculoskeletal: Normal range of motion. She exhibits no edema or tenderness.  W/c dependent. Chronic back pain is well managed with Tylenol 650mg  tid.   Lymphadenopathy:    She has no cervical adenopathy.  Neurological: She is alert and oriented to person, place, and time. She displays normal reflexes. No cranial nerve deficit. She exhibits normal muscle tone. Coordination normal.  Skin: Skin is warm and dry. No rash noted. She is not diaphoretic.  Psychiatric: Thought content normal. Her mood appears anxious. Her affect is inappropriate. Her speech is tangential. She is agitated and combative. Cognition and memory are impaired. She expresses impulsivity and inappropriate judgment. She exhibits a depressed mood. She exhibits abnormal recent memory and abnormal remote memory.  Restlessness    Filed Vitals:   02/09/15 1525  BP: 120/80  Pulse: 80  Temp: 98.4 F (36.9 C)  TempSrc: Tympanic  Resp: 16      Labs reviewed: Basic Metabolic Panel: No results for input(s): NA, K, CL, CO2, GLUCOSE, BUN, CREATININE, CALCIUM, MG, PHOS, TSH in the last 8760 hours. Liver  Function Tests: No results for input(s): AST, ALT, ALKPHOS, BILITOT, PROT, ALBUMIN in the last 8760 hours. No results for input(s): LIPASE, AMYLASE in the last 8760 hours. No results for input(s): AMMONIA in the last 8760 hours. CBC: No results for input(s): WBC, NEUTROABS, HGB, HCT, MCV, PLT in the last 8760 hours. Lipid Panel: No results for input(s): CHOL, HDL, LDLCALC, TRIG, CHOLHDL, LDLDIRECT in the last 8760 hours.  Past Procedures:  None recently.   Assessment/Plan Acute bronchitis Onset 02/06/15 T 101.7, productive cough, 7 day course Avelox 400mg  daily started. Afebrile today. Persisted congestive cough presented upon my visit. Will add Neb q6hr x 48 hrs then prn x 1 wk then dc. Prednisone taper dose: 40mg  daily x 3 days, then 20mg  daily x 3 days, then 5mg  daiy x 3 days then dc. Observe the  patient. Hospice consult delayed her admission to Hospice service   Hx of seizure disorder No active seizures since last seen. Continue Depakote.      Constipation Stable on MiraLax qod and Senokot S II qhs.     Dementia in chronic affective disorder 01/12/15 dc am Depakote and continue 250mg  pm.    Essential hypertension Controlled. Not taking antihypertensive agents.     Lower back pain Controlled, takesTylenl 650mg  tid      Major depressive disorder, single episode Restlessness, mood is stabilized, continue Depakote.      Esophageal reflux Stable, takes Famotidine 20mg       Family/ Staff Communication: observe the patient  Goals of Care: SNF  Labs/tests ordered: none

## 2015-02-09 NOTE — Assessment & Plan Note (Signed)
Controlled, takesTylenl 650mg  tid

## 2015-03-11 ENCOUNTER — Non-Acute Institutional Stay (SKILLED_NURSING_FACILITY): Payer: Medicare Other | Admitting: Nurse Practitioner

## 2015-03-11 ENCOUNTER — Encounter: Payer: Self-pay | Admitting: Nurse Practitioner

## 2015-03-11 DIAGNOSIS — I10 Essential (primary) hypertension: Secondary | ICD-10-CM

## 2015-03-11 DIAGNOSIS — J209 Acute bronchitis, unspecified: Secondary | ICD-10-CM

## 2015-03-11 DIAGNOSIS — K219 Gastro-esophageal reflux disease without esophagitis: Secondary | ICD-10-CM

## 2015-03-11 DIAGNOSIS — K5900 Constipation, unspecified: Secondary | ICD-10-CM | POA: Diagnosis not present

## 2015-03-11 DIAGNOSIS — F324 Major depressive disorder, single episode, in partial remission: Secondary | ICD-10-CM

## 2015-03-11 DIAGNOSIS — F0280 Dementia in other diseases classified elsewhere without behavioral disturbance: Secondary | ICD-10-CM | POA: Diagnosis not present

## 2015-03-11 DIAGNOSIS — Z8669 Personal history of other diseases of the nervous system and sense organs: Secondary | ICD-10-CM | POA: Diagnosis not present

## 2015-03-11 DIAGNOSIS — M5440 Lumbago with sciatica, unspecified side: Secondary | ICD-10-CM | POA: Diagnosis not present

## 2015-03-11 DIAGNOSIS — F39 Unspecified mood [affective] disorder: Secondary | ICD-10-CM | POA: Diagnosis not present

## 2015-03-11 NOTE — Assessment & Plan Note (Signed)
MMSE 9/30 02/07/14 Off Namenda and Aricept 02/26/14 01/12/15 dc am Depakote and continue 250mg  pm.

## 2015-03-11 NOTE — Assessment & Plan Note (Signed)
No active seizures since last seen. Continue Depakote.

## 2015-03-11 NOTE — Progress Notes (Signed)
Patient ID: Veronica Sanchez, female   DOB: 1924-02-15, 81 y.o.   MRN: [id number]   Code Status: DNR  Allergies  Allergen Reactions  . Amoxicillin     REACTION: unspecified  . Celebrex [Celecoxib]   . Codeine Sulfate     REACTION: unspecified  . Naproxen Sodium     REACTION: unspecified  . Reminyl [Galantamine Hydrobromide]   . Versed [Midazolam]   . Vioxx [Rofecoxib]     Chief Complaint  Patient presents with  . Medical Management of Chronic Issues    HPI: Patient is a 81 y.o. female seen in the SNF at [facility] today for evaluation of chronic medical conditions.  Problem List Items Addressed This Visit    Dementia in chronic affective disorder    MMSE 9/30 02/07/14 Off Namenda and Aricept 02/26/14 01/12/15 dc am Depakote and continue 250mg  pm.       Essential hypertension    Controlled. Not taking antihypertensive agents.        Lower back pain    Controlled, takesTylenl 650mg  tid         Major depressive disorder, single episode    Restlessness, mood is stabilized, continue Depakote.         Esophageal reflux    Stable, takes Famotidine 20mg         Constipation    Stable on MiraLax qod and Senokot S II qhs.        Hx of seizure disorder    No active seizures since last seen. Continue Depakote.        Acute bronchitis - Primary    Resolved.          Review of Systems:  Review of Systems  Constitutional: Negative for fever, chills and diaphoresis.  HENT: Positive for hearing loss. Negative for congestion, ear discharge, ear pain, nosebleeds, sore throat and tinnitus.   Eyes: Negative for photophobia, pain, discharge and redness.  Respiratory: Negative for cough, shortness of breath, wheezing and stridor.   Cardiovascular: Negative for chest pain, palpitations and leg swelling.  Gastrointestinal: Negative for nausea, vomiting, abdominal pain, diarrhea, constipation and blood in stool.  Endocrine: Negative for polydipsia.    Genitourinary: Positive for frequency. Negative for dysuria, urgency, hematuria and flank pain.       Urinary incontinence  Musculoskeletal: Positive for back pain. Negative for myalgias and neck pain.       W/c dependent  Skin: Negative for rash.  Allergic/Immunologic: Negative for environmental allergies.  Neurological: Negative for dizziness, tremors, seizures, weakness and headaches.  Hematological: Does not bruise/bleed easily.  Psychiatric/Behavioral: Negative for suicidal ideas and hallucinations. The patient is nervous/anxious.      Past Medical History  Diagnosis Date  . Depression    History reviewed. No pertinent past surgical history. Social History:   reports that she has quit smoking. Her smoking use included Cigarettes. She quit after 30 years of use. She does not have any smokeless tobacco history on file. She reports that she does not drink alcohol or use illicit drugs.  Family History  Problem Relation Age of Onset  . Stroke Mother   . Dementia Mother   . Alcohol abuse Father   . Arthritis Sister   . Arthritis Sister     Medications: Patient's Medications  New Prescriptions   No medications on file  Previous Medications   ACETAMINOPHEN (TYLENOL) 650 MG SUPPOSITORY    Place 650 mg rectally 3 (three) times daily.   DIVALPROEX (DEPAKOTE) 250 MG  DR TABLET    Take 250 mg by mouth 2 (two) times daily.   FAMOTIDINE (PEPCID) 20 MG TABLET    Take 20 mg by mouth daily. Take 1 tablet daily.   POLYETHYLENE GLYCOL (MIRALAX / GLYCOLAX) PACKET    Take 17 g by mouth daily. Fill cap to 17 gm mark, mix with 4 ounces of fluid and take by mouth daily.   SENNOSIDES-DOCUSATE SODIUM (SENOKOT-S) 8.6-50 MG TABLET    Take 2 tablets by mouth daily.  Modified Medications   No medications on file  Discontinued Medications   No medications on file     Physical Exam: Physical Exam  Constitutional: She is oriented to person, place, and time. She appears well-developed and  well-nourished. No distress.  HENT:  Head: Normocephalic and atraumatic.  Eyes: Conjunctivae and EOM are normal. Pupils are equal, round, and reactive to light.  Neck: Normal range of motion. Neck supple. No JVD present. No thyromegaly present.  Cardiovascular: Normal rate and regular rhythm.   No murmur heard. Pulmonary/Chest: Effort normal and breath sounds normal. She has no wheezes. She has no rales.  Abdominal: Soft. Bowel sounds are normal. There is no tenderness.  Musculoskeletal: Normal range of motion. She exhibits no edema or tenderness.  W/c dependent. Chronic back pain is well managed with Tylenol 650mg  tid.   Lymphadenopathy:    She has no cervical adenopathy.  Neurological: She is alert and oriented to person, place, and time. She displays normal reflexes. No cranial nerve deficit. She exhibits normal muscle tone. Coordination normal.  Skin: Skin is warm and dry. No rash noted. She is not diaphoretic.  Psychiatric: Thought content normal. Her mood appears anxious. Her affect is inappropriate. Her speech is tangential. She is agitated and combative. Cognition and memory are impaired. She expresses impulsivity and inappropriate judgment. She exhibits a depressed mood. She exhibits abnormal recent memory and abnormal remote memory.  Restlessness    Filed Vitals:   03/11/15 1546  BP: 118/68  Pulse: 68  Temp: 97.1 F (36.2 C)  TempSrc: Tympanic  Resp: 18      Labs reviewed: Basic Metabolic Panel: No results for input(s): NA, K, CL, CO2, GLUCOSE, BUN, CREATININE, CALCIUM, MG, PHOS, TSH in the last 8760 hours. Liver Function Tests: No results for input(s): AST, ALT, ALKPHOS, BILITOT, PROT, ALBUMIN in the last 8760 hours. No results for input(s): LIPASE, AMYLASE in the last 8760 hours. No results for input(s): AMMONIA in the last 8760 hours. CBC: No results for input(s): WBC, NEUTROABS, HGB, HCT, MCV, PLT in the last 8760 hours. Lipid Panel: No results for input(s):  CHOL, HDL, LDLCALC, TRIG, CHOLHDL, LDLDIRECT in the last 8760 hours.  Past Procedures:  None recently.   Assessment/Plan Acute bronchitis Resolved.   Hx of seizure disorder No active seizures since last seen. Continue Depakote.    Constipation Stable on MiraLax qod and Senokot S II qhs.    Esophageal reflux Stable, takes Famotidine 20mg     Major depressive disorder, single episode Restlessness, mood is stabilized, continue Depakote.     Lower back pain Controlled, takesTylenl 650mg  tid     Essential hypertension Controlled. Not taking antihypertensive agents.    Dementia in chronic affective disorder MMSE 9/30 02/07/14 Off Namenda and Aricept 02/26/14 01/12/15 dc am Depakote and continue 250mg  pm.     Family/ Staff Communication: observe the patient  Goals of Care: SNF  Labs/tests ordered: none

## 2015-03-11 NOTE — Assessment & Plan Note (Signed)
Stable, takes Famotidine 20mg 

## 2015-03-11 NOTE — Assessment & Plan Note (Signed)
Restlessness, mood is stabilized, continue Depakote.

## 2015-03-11 NOTE — Assessment & Plan Note (Signed)
Stable on MiraLax qod and Senokot S II qhs.

## 2015-03-11 NOTE — Assessment & Plan Note (Signed)
Controlled, takesTylenl 650mg  tid

## 2015-03-11 NOTE — Assessment & Plan Note (Signed)
Resolved

## 2015-03-11 NOTE — Assessment & Plan Note (Signed)
Controlled. Not taking antihypertensive agents.

## 2015-03-23 ENCOUNTER — Encounter: Payer: Self-pay | Admitting: Internal Medicine

## 2015-03-23 ENCOUNTER — Non-Acute Institutional Stay (SKILLED_NURSING_FACILITY): Payer: Medicare Other | Admitting: Internal Medicine

## 2015-03-23 DIAGNOSIS — Z8669 Personal history of other diseases of the nervous system and sense organs: Secondary | ICD-10-CM | POA: Diagnosis not present

## 2015-03-23 DIAGNOSIS — F0280 Dementia in other diseases classified elsewhere without behavioral disturbance: Secondary | ICD-10-CM | POA: Diagnosis not present

## 2015-03-23 DIAGNOSIS — C44310 Basal cell carcinoma of skin of unspecified parts of face: Secondary | ICD-10-CM | POA: Insufficient documentation

## 2015-03-23 DIAGNOSIS — F39 Unspecified mood [affective] disorder: Secondary | ICD-10-CM

## 2015-03-23 DIAGNOSIS — M5440 Lumbago with sciatica, unspecified side: Secondary | ICD-10-CM | POA: Diagnosis not present

## 2015-03-23 DIAGNOSIS — I10 Essential (primary) hypertension: Secondary | ICD-10-CM | POA: Diagnosis not present

## 2015-03-23 DIAGNOSIS — J209 Acute bronchitis, unspecified: Secondary | ICD-10-CM | POA: Diagnosis not present

## 2015-03-23 HISTORY — DX: Basal cell carcinoma of skin of unspecified parts of face: C44.310

## 2015-03-23 NOTE — Progress Notes (Addendum)
Patient ID: Veronica Sanchez, female   DOB: 31-Jul-1924, 81 y.o.   MRN: [id number]    HISTORY AND PHYSICAL  Location:  [place] Room Number: 102 Place of Service: SNF (31)   Extended Emergency Contact Information Primary Emergency Contact: [facility] Address: [address]          [place] Phone: [phone] Relation: None  Advanced Directive information Does patient have an advance directive?: Yes, Type of Advance Directive: Living will;Out of facility DNR (pink MOST or yellow form), Pre-existing out of facility DNR order (yellow form or pink MOST form): Yellow form placed in chart (order not valid for inpatient use), Does patient want to make changes to advanced directive?: No - Patient declined  Chief Complaint  Patient presents with  . Annual Exam    HPI:  Acute bronchitis, unspecified organism: Treated for acute bronchitis May 2016. She has a slight wet cough at present but appears to be over the bronchitis. She was treated with prednisone, Avelox, and duo nebs. Lungs clear if this time.  Essential hypertension: Controlled  Hx of seizure disorder: Currently on Depakote. Last seizure was years ago.  Midline low back pain with sciatica, sciatica laterality unspecified: No longer walking. No back discomfort to palpation.  Dementia in chronic affective disorder: Progressive and most consistent with Alzheimer's disease.    Past Medical History  Diagnosis Date  . Depression   . Hyperlipidemia   . Alzheimer's disease   . Hypertension   . GERD (gastroesophageal reflux disease)   . Backache   . Spinal stenosis   . Colon polyp   . Diverticulosis   . Osteoarthritis   . Osteoporosis     07/23/09 x-ray of the lumbar and thoracic spine showed an L1/L2 mild anterior wedge deformity  . Seizure disorder   . Unstable gait     Past Surgical History  Procedure Laterality Date  . Tonsillectomy and adenoidectomy Bilateral  1933  . Cataract extraction      Dr. Katy Fitch  . Colonoscopy  02/01/2002    AVM of the ascending colon, 3 mm polyp, AVM and transverse colon, diverticulosis  . Esophagogastroduodenoscopy  07/05/2005    Esophageal stricture, 4 cm hiatal hernia  . Epidural block injection  2007    Patient Care Team: Lisabeth Pick, MD as PCP - General Clent Jacks, MD as Consulting Physician (Ophthalmology) Gatha Mayer, MD as Consulting Physician (Gastroenterology) Nicholaus Bloom, MD as Consulting Physician (Pain Medicine)  History   Social History  . Marital Status: Married    Spouse Name: N/A  . Number of Children: N/A  . Years of Education: N/A   Occupational History  . Not on file.   Social History Main Topics  . Smoking status: Former Smoker -- 30 years    Types: Cigarettes  . Smokeless tobacco: Not on file  . Alcohol Use: No  . Drug Use: No  . Sexual Activity: Not on file   Other Topics Concern  . Not on file   Social History Narrative   Married 1950.   [facility].   Previously employed as a Network engineer   History of previous smoking one half pack per day for 20-30 years.   No significant use of alcohol.     reports that she has quit smoking. Her smoking use included Cigarettes. She quit after 30 years of use. She does not have any smokeless tobacco history on  file. She reports that she does not drink alcohol or use illicit drugs.  Family History  Problem Relation Age of Onset  . Stroke Mother   . Dementia Mother   . Alcohol abuse Father   . Arthritis Sister   . Arthritis Sister    Family Status  Relation Status Death Age  . Mother Deceased 53    Stroke  . Father Deceased 19    Heart failure  . Sister Alive   . Daughter Alive   . Son Alive   . Sister Alive     Immunization History  Administered Date(s) Administered  . Influenza Whole 07/06/2007, 06/18/2008  . Influenza-Unspecified 07/30/2014  . Pneumococcal Conjugate-13 09/26/2001    . Zoster 04/11/2007    Allergies  Allergen Reactions  . Amoxicillin     REACTION: unspecified  . Celebrex [Celecoxib]   . Codeine Sulfate     REACTION: unspecified  . Naproxen Sodium     REACTION: unspecified  . Reminyl [Galantamine Hydrobromide]   . Versed [Midazolam]   . Vioxx [Rofecoxib]     Medications: Patient's Medications  New Prescriptions   No medications on file  Previous Medications   ACETAMINOPHEN (TYLENOL) 650 MG SUPPOSITORY    Place 650 mg rectally 3 (three) times daily.   DIVALPROEX (DEPAKOTE) 250 MG DR TABLET    Take 250 mg by mouth 2 (two) times daily.   FAMOTIDINE (PEPCID) 20 MG TABLET    Take 20 mg by mouth daily. Take 1 tablet daily.   POLYETHYLENE GLYCOL (MIRALAX / GLYCOLAX) PACKET    Take 17 g by mouth daily. Fill cap to 17 gm mark, mix with 4 ounces of fluid and take by mouth daily.   SENNOSIDES-DOCUSATE SODIUM (SENOKOT-S) 8.6-50 MG TABLET    Take 2 tablets by mouth daily.  Modified Medications   No medications on file  Discontinued Medications   No medications on file    Review of Systems  Constitutional: Negative for fever, chills and diaphoresis.  HENT: Positive for hearing loss. Negative for congestion, ear discharge, ear pain, nosebleeds, sore throat and tinnitus.   Eyes: Negative for photophobia, pain, discharge and redness.  Respiratory: Negative for cough, shortness of breath, wheezing and stridor.   Cardiovascular: Negative for chest pain, palpitations and leg swelling.  Gastrointestinal: Negative for nausea, vomiting, abdominal pain, diarrhea, constipation and blood in stool.  Endocrine: Negative for polydipsia.  Genitourinary: Positive for frequency. Negative for dysuria, urgency, hematuria and flank pain.       Urinary incontinence  Musculoskeletal: Positive for back pain. Negative for myalgias and neck pain.       W/c dependent  Skin: Negative for rash.       Growing lesions on both cheeks  Allergic/Immunologic: Negative for  environmental allergies.  Neurological: Negative for dizziness, tremors, seizures, weakness and headaches.       Severe dementia  Hematological: Does not bruise/bleed easily.  Psychiatric/Behavioral: Positive for confusion and decreased concentration. Negative for suicidal ideas and hallucinations. The patient is nervous/anxious.     Filed Vitals:   03/23/15 1241  BP: 118/68  Pulse: 68  Temp: 97.4 F (36.3 C)  Resp: 18  Height: 5\' 1"  (1.549 m)  Weight: 132 lb (59.875 kg)  SpO2: 97%   Body mass index is 24.95 kg/(m^2).  Physical Exam  Constitutional: She is oriented to person, place, and time. She appears well-developed and well-nourished. No distress.  HENT:  Head: Normocephalic and atraumatic.  Eyes: Conjunctivae and EOM are normal. Pupils  are equal, round, and reactive to light.  Neck: Normal range of motion. Neck supple. No JVD present. No thyromegaly present.  Cardiovascular: Normal rate and regular rhythm.   No murmur heard. Pulmonary/Chest: Effort normal and breath sounds normal. She has no wheezes. She has no rales.  Abdominal: Soft. Bowel sounds are normal. There is no tenderness.  Musculoskeletal: Normal range of motion. She exhibits no edema or tenderness.  W/c dependent. Chronic back pain is well managed with Tylenol 650mg  tid.   Lymphadenopathy:    She has no cervical adenopathy.  Neurological: She is alert and oriented to person, place, and time. She displays normal reflexes. No cranial nerve deficit. She exhibits normal muscle tone. Coordination normal.  02/10/2014 MMSE 9/30 History of agitation requiring Depakote. Patient is drowsy and complacent today. Difficult to get her to answer any questions.  Skin: Skin is warm and dry. No rash noted. She is not diaphoretic.  Basal cell cancer of the left cheek and possibly of the right upper cheek. Both lesions have a papular base and then umbilicated center. There is no bleeding or definite ulceration.  Psychiatric:  Thought content normal. Her mood appears anxious. Her affect is inappropriate. Her speech is tangential. She is agitated and combative. Cognition and memory are impaired. She expresses impulsivity and inappropriate judgment. She exhibits a depressed mood. She exhibits abnormal recent memory and abnormal remote memory.     Labs reviewed: No visits with results within 3 Month(s) from this visit. Latest known visit with results is:  Nursing Home on 01/23/2013  Component Date Value Ref Range Status  . Hemoglobin 07/26/2012 12.9  12.0 - 16.0 g/dL Final  . HCT 07/26/2012 38  36 - 46 % Final  . Platelets 07/26/2012 171  150 - 399 K/L Final  . WBC 07/26/2012 7.7   Final  . Glucose 07/26/2012 72   Final  . BUN 07/26/2012 42* 4 - 21 mg/dL Final  . Creatinine 07/26/2012 1.3* 0.5 - 1.1 mg/dL Final  . Potassium 07/26/2012 4.7  3.4 - 5.3 mmol/L Final  . Sodium 07/26/2012 143  137 - 147 mmol/L Final  . Alkaline Phosphatase 07/26/2012 76  25 - 125 U/L Final  . ALT 07/26/2012 8  7 - 35 U/L Final  . AST 07/26/2012 16  13 - 35 U/L Final  . Bilirubin, Total 07/26/2012 0.4   Final  . TSH 07/26/2012 1.48  0.41 - 5.90 uIU/mL Final       Assessment/Plan  1. Acute bronchitis, unspecified organism Resolved  2. Essential hypertension Controlled  3. Hx of seizure disorder Remote and no recent seizures. Try off Depakote.  4. Midline low back pain with sciatica, sciatica laterality unspecified Asymptomatic today  5. Dementia in chronic affective disorder Alzheimer's disease. Progressing slowly. Although there are episodes of agitation in the past, this is not commented upon in several months of nursing notes. We will try her off the Depakote.  6. Basal cell cancer the face Refer to dermatologist.

## 2015-06-10 ENCOUNTER — Encounter: Payer: Self-pay | Admitting: Nurse Practitioner

## 2015-06-10 ENCOUNTER — Non-Acute Institutional Stay (SKILLED_NURSING_FACILITY): Payer: Medicare Other | Admitting: Nurse Practitioner

## 2015-06-10 DIAGNOSIS — I10 Essential (primary) hypertension: Secondary | ICD-10-CM

## 2015-06-10 DIAGNOSIS — M5440 Lumbago with sciatica, unspecified side: Secondary | ICD-10-CM | POA: Diagnosis not present

## 2015-06-10 DIAGNOSIS — K219 Gastro-esophageal reflux disease without esophagitis: Secondary | ICD-10-CM | POA: Diagnosis not present

## 2015-06-10 DIAGNOSIS — F320 Major depressive disorder, single episode, mild: Secondary | ICD-10-CM

## 2015-06-10 DIAGNOSIS — F39 Unspecified mood [affective] disorder: Secondary | ICD-10-CM

## 2015-06-10 DIAGNOSIS — F0280 Dementia in other diseases classified elsewhere without behavioral disturbance: Secondary | ICD-10-CM

## 2015-06-10 DIAGNOSIS — Z8669 Personal history of other diseases of the nervous system and sense organs: Secondary | ICD-10-CM

## 2015-06-10 NOTE — Assessment & Plan Note (Signed)
Wean off life long antiepileptic mes. No active seizures noted since off meds.

## 2015-06-10 NOTE — Assessment & Plan Note (Signed)
Controlled, takesTylenl 650mg tid   

## 2015-06-10 NOTE — Assessment & Plan Note (Signed)
Stable, continue Famotidine 20mg 

## 2015-06-10 NOTE — Assessment & Plan Note (Signed)
Controlled. Not taking antihypertensive agents.   

## 2015-06-10 NOTE — Progress Notes (Signed)
Patient ID: Veronica Sanchez, female   DOB: November 13, 1923, 81 y.o.   MRN: [id number]  Location:  SNF [facility] Provider:  Marlana Latus NP  Code Status:  DNR Goals of care: Advanced Directive information    Chief Complaint  Patient presents with  . Medical Management of Chronic Issues     HPI: Patient is a 81 y.o. female seen in the SNF at [facility] today for evaluation of chronic medical conditions. Hx of seizures, successfully weaned off life long antiepileptic agents. Hx of HTN, normal Bp, off meds. Her chronic back pain is better managed since she is no longer ambulating, scheduled Tylenol is adequate for pain management. She has end stage of dementia, total dependent of her ADLs except feeding self sometimes. Her mood is stable except sometimes she is agitated and combative due to her lack of understanding of surroundings.   Review of Systems:  Review of Systems  Constitutional: Negative for fever and chills.  HENT: Positive for hearing loss. Negative for congestion, ear discharge, ear pain, nosebleeds and sore throat.   Eyes: Negative for pain, discharge and redness.  Respiratory: Negative for cough, shortness of breath and wheezing.   Cardiovascular: Negative for chest pain, palpitations and leg swelling.  Gastrointestinal: Negative for nausea, vomiting, abdominal pain, diarrhea and constipation.  Genitourinary: Positive for frequency. Negative for dysuria, urgency and flank pain.       Urinary incontinence  Musculoskeletal: Positive for back pain. Negative for myalgias and neck pain.       W/c dependent  Skin: Negative for rash.  Neurological: Negative for dizziness, tremors, seizures, weakness and headaches.  Psychiatric/Behavioral: Positive for memory loss. Negative for suicidal ideas and hallucinations. The patient is nervous/anxious.     Past Medical History  Diagnosis Date  . Depression   . Hyperlipidemia   . Alzheimer's disease   . Hypertension   . GERD  (gastroesophageal reflux disease)   . Backache   . Spinal stenosis   . Colon polyp   . Diverticulosis   . Osteoarthritis   . Osteoporosis     07/23/09 x-ray of the lumbar and thoracic spine showed an L1/L2 mild anterior wedge deformity  . Seizure disorder   . Unstable gait   . Constipation 03/06/2013    03/19/14: Senokot S II qhs. Continue MiraLax qod     Patient Active Problem List   Diagnosis Date Noted  . Basal cell carcinoma of face 03/23/2015  . Acute bronchitis 02/09/2015  . Hx of seizure disorder 12/04/2013  . DNR (do not resuscitate) 03/06/2013  . Constipation 03/06/2013  . Seasonal allergies 02/13/2013  . Major depressive disorder, single episode 12/26/2012  . Esophageal reflux 12/26/2012  . Lower back pain 10/19/2007  . HYPERLIPIDEMIA 06/20/2007  . Dementia in chronic affective disorder 06/20/2007  . Essential hypertension 06/20/2007    Allergies  Allergen Reactions  . Amoxicillin     REACTION: unspecified  . Celebrex [Celecoxib]   . Codeine Sulfate     REACTION: unspecified  . Naproxen Sodium     REACTION: unspecified  . Reminyl [Galantamine Hydrobromide]   . Versed [Midazolam]   . Vioxx [Rofecoxib]     Medications: Patient's Medications  New Prescriptions   No medications on file  Previous Medications   ACETAMINOPHEN (TYLENOL) 650 MG SUPPOSITORY    Place 650 mg rectally 3 (three) times daily.   FAMOTIDINE (PEPCID) 20 MG TABLET    Take 20 mg by mouth daily. Take 1 tablet daily.   POLYETHYLENE  GLYCOL (MIRALAX / GLYCOLAX) PACKET    Take 17 g by mouth daily. Fill cap to 17 gm mark, mix with 4 ounces of fluid and take by mouth daily.   SENNOSIDES-DOCUSATE SODIUM (SENOKOT-S) 8.6-50 MG TABLET    Take 2 tablets by mouth daily.  Modified Medications   No medications on file  Discontinued Medications   No medications on file    Physical Exam: Filed Vitals:   06/10/15 1208  BP: 118/68  Pulse: 68  Temp: 98.4 F (36.9 C)  TempSrc: Tympanic  Resp: 18    There is no weight on file to calculate BMI.  Physical Exam  Constitutional: She is oriented to person, place, and time. She appears well-developed and well-nourished. No distress.  HENT:  Head: Normocephalic and atraumatic.  Eyes: Conjunctivae and EOM are normal. Pupils are equal, round, and reactive to light.  Neck: Normal range of motion. Neck supple. No JVD present. No thyromegaly present.  Cardiovascular: Normal rate and regular rhythm.   No murmur heard. Pulmonary/Chest: Effort normal and breath sounds normal. She has no wheezes. She has no rales.  Abdominal: Soft. Bowel sounds are normal. There is no tenderness.  Musculoskeletal: Normal range of motion. She exhibits no edema or tenderness.  W/c dependent. Chronic back pain is well managed with Tylenol 650mg  tid.   Lymphadenopathy:    She has no cervical adenopathy.  Neurological: She is alert and oriented to person, place, and time. She displays normal reflexes. No cranial nerve deficit. She exhibits normal muscle tone. Coordination normal.  Skin: Skin is warm and dry. No rash noted. She is not diaphoretic.  Psychiatric: Thought content normal. Her mood appears anxious. Her affect is inappropriate. Her speech is tangential. She is agitated and combative. Cognition and memory are impaired. She expresses impulsivity and inappropriate judgment. She exhibits a depressed mood. She exhibits abnormal recent memory and abnormal remote memory.    Labs reviewed: Basic Metabolic Panel: No results for input(s): NA, K, CL, CO2, GLUCOSE, BUN, CREATININE, CALCIUM, MG, PHOS in the last 8760 hours.  Liver Function Tests: No results for input(s): AST, ALT, ALKPHOS, BILITOT, PROT, ALBUMIN in the last 8760 hours.  CBC: No results for input(s): WBC, NEUTROABS, HGB, HCT, MCV, PLT in the last 8760 hours.  Lab Results  Component Value Date   TSH 1.48 07/26/2012   No results found for: HGBA1C Lab Results  Component Value Date   CHOL 222*  10/19/2007   HDL 102.5 10/19/2007   LDLDIRECT 103.3 10/19/2007   TRIG 60 10/19/2007   CHOLHDL 2.2 CALC 10/19/2007    Significant Diagnostic Results since last visit: none  Patient Care Team: Clent Jacks, MD as Consulting Physician (Ophthalmology) Gatha Mayer, MD as Consulting Physician (Gastroenterology) Nicholaus Bloom, MD as Consulting Physician (Pain Medicine)  Assessment/Plan Problem List Items Addressed This Visit    Dementia in chronic affective disorder - Primary    MMSE 9/30 02/07/14 Off Namenda and Aricept 02/26/14 01/12/15 dc am Depakote and continue 250mg  pm. Advanced stage, total dependent except feeding self sometimes, off meds, [facility] for care needs, no behaviors problem      Essential hypertension    Controlled. Not taking antihypertensive agents.       Lower back pain    Controlled, takesTylenl 650mg  tid      Major depressive disorder, single episode    Controlled, takesTylenl 650mg  tid       Esophageal reflux    Stable, continue Famotidine 20mg       Hx  of seizure disorder    Wean off life long antiepileptic mes. No active seizures noted since off meds.           Family/ staff Communication: continue SNF [facility] for care needs.   Labs/tests ordered:  none  ManXie Kshawn Canal NP [facility] [address] [place], [place] [address] On Call:  [phone] & follow prompts after 5pm & weekends Office Phone:  [phone] Office Fax:  [phone]

## 2015-06-10 NOTE — Assessment & Plan Note (Signed)
MMSE 9/30 02/07/14 Off Namenda and Aricept 02/26/14 01/12/15 dc am Depakote and continue 250mg  pm. Advanced stage, total dependent except feeding self sometimes, off meds, [facility] for care needs, no behaviors problem

## 2015-07-27 ENCOUNTER — Encounter: Payer: Self-pay | Admitting: Nurse Practitioner

## 2015-07-27 ENCOUNTER — Non-Acute Institutional Stay (SKILLED_NURSING_FACILITY): Payer: Medicare Other | Admitting: Nurse Practitioner

## 2015-07-27 DIAGNOSIS — Z8669 Personal history of other diseases of the nervous system and sense organs: Secondary | ICD-10-CM | POA: Diagnosis not present

## 2015-07-27 DIAGNOSIS — K219 Gastro-esophageal reflux disease without esophagitis: Secondary | ICD-10-CM

## 2015-07-27 DIAGNOSIS — F320 Major depressive disorder, single episode, mild: Secondary | ICD-10-CM

## 2015-07-27 DIAGNOSIS — F39 Unspecified mood [affective] disorder: Secondary | ICD-10-CM | POA: Diagnosis not present

## 2015-07-27 DIAGNOSIS — M5440 Lumbago with sciatica, unspecified side: Secondary | ICD-10-CM

## 2015-07-27 DIAGNOSIS — F0280 Dementia in other diseases classified elsewhere without behavioral disturbance: Secondary | ICD-10-CM | POA: Diagnosis not present

## 2015-07-27 DIAGNOSIS — K5900 Constipation, unspecified: Secondary | ICD-10-CM | POA: Diagnosis not present

## 2015-07-27 NOTE — Assessment & Plan Note (Signed)
Stable, continue Famotidine 20mg 

## 2015-07-27 NOTE — Assessment & Plan Note (Signed)
Controlled, takesTylenl 650mg tid   

## 2015-07-27 NOTE — Assessment & Plan Note (Signed)
Continue to be seizure free.

## 2015-07-27 NOTE — Assessment & Plan Note (Signed)
Mood is stable, off all meds

## 2015-07-27 NOTE — Progress Notes (Signed)
Patient ID: Veronica Sanchez, female   DOB: 24-May-1924, 81 y.o.   MRN: [id number]  Location:  SNF [facility] Provider:  Marlana Latus NP  Code Status:  DNR Goals of care: Advanced Directive information    Chief Complaint  Patient presents with  . Medical Management of Chronic Issues     HPI: Patient is a 81 y.o. female seen in the SNF at [facility] today for evaluation of chronic medical conditions. Hx of seizures, successfully weaned off life long antiepileptic agents. Hx of HTN, normal Bp, off meds. Her chronic back pain is better managed since she is no longer ambulating, scheduled Tylenol is adequate for pain management. She has end stage of dementia, total dependent of her ADLs except feeding self sometimes. Her mood is stable except sometimes she is agitated and combative due to her lack of understanding of surroundings.   Review of Systems:  Review of Systems  Constitutional: Negative for fever and chills.  HENT: Positive for hearing loss. Negative for congestion, ear discharge, ear pain, nosebleeds and sore throat.   Eyes: Negative for pain, discharge and redness.  Respiratory: Negative for cough, shortness of breath and wheezing.   Cardiovascular: Negative for chest pain, palpitations and leg swelling.  Gastrointestinal: Negative for nausea, vomiting, abdominal pain, diarrhea and constipation.  Genitourinary: Positive for frequency. Negative for dysuria, urgency and flank pain.       Urinary incontinence  Musculoskeletal: Positive for back pain. Negative for myalgias and neck pain.       W/c dependent  Skin: Negative for rash.  Neurological: Negative for dizziness, tremors, seizures, weakness and headaches.  Psychiatric/Behavioral: Positive for memory loss. Negative for suicidal ideas and hallucinations. The patient is nervous/anxious.     Past Medical History  Diagnosis Date  . Depression   . Hyperlipidemia   . Alzheimer's disease   . Hypertension   . GERD  (gastroesophageal reflux disease)   . Backache   . Spinal stenosis   . Colon polyp   . Diverticulosis   . Osteoarthritis   . Osteoporosis     07/23/09 x-ray of the lumbar and thoracic spine showed an L1/L2 mild anterior wedge deformity  . Seizure disorder ([place])   . Unstable gait   . Constipation 03/06/2013    03/19/14: Senokot S II qhs. Continue MiraLax qod     Patient Active Problem List   Diagnosis Date Noted  . Basal cell carcinoma of face 03/23/2015  . Acute bronchitis 02/09/2015  . Hx of seizure disorder 12/04/2013  . DNR (do not resuscitate) 03/06/2013  . Constipation 03/06/2013  . Seasonal allergies 02/13/2013  . Major depressive disorder, single episode 12/26/2012  . Esophageal reflux 12/26/2012  . Lower back pain 10/19/2007  . HYPERLIPIDEMIA 06/20/2007  . Dementia in chronic affective disorder ([place]) 06/20/2007  . Essential hypertension 06/20/2007    Allergies  Allergen Reactions  . Amoxicillin     REACTION: unspecified  . Celebrex [Celecoxib]   . Codeine Sulfate     REACTION: unspecified  . Naproxen Sodium     REACTION: unspecified  . Reminyl [Galantamine Hydrobromide]   . Versed [Midazolam]   . Vioxx [Rofecoxib]     Medications: Patient's Medications  New Prescriptions   No medications on file  Previous Medications   ACETAMINOPHEN (TYLENOL) 650 MG SUPPOSITORY    Place 650 mg rectally 3 (three) times daily.   FAMOTIDINE (PEPCID) 20 MG TABLET    Take 20 mg by mouth daily. Take 1 tablet daily.  POLYETHYLENE GLYCOL (MIRALAX / GLYCOLAX) PACKET    Take 17 g by mouth daily. Fill cap to 17 gm mark, mix with 4 ounces of fluid and take by mouth daily.   SENNOSIDES-DOCUSATE SODIUM (SENOKOT-S) 8.6-50 MG TABLET    Take 2 tablets by mouth daily.  Modified Medications   No medications on file  Discontinued Medications   No medications on file    Physical Exam: Filed Vitals:   07/27/15 1524  BP: 128/64  Pulse: 76  Temp: 98.1 F (36.7 C)  TempSrc: Tympanic    Resp: 16   There is no weight on file to calculate BMI.  Physical Exam  Constitutional: She is oriented to person, place, and time. She appears well-developed and well-nourished. No distress.  HENT:  Head: Normocephalic and atraumatic.  Eyes: Conjunctivae and EOM are normal. Pupils are equal, round, and reactive to light.  Neck: Normal range of motion. Neck supple. No JVD present. No thyromegaly present.  Cardiovascular: Normal rate and regular rhythm.   No murmur heard. Pulmonary/Chest: Effort normal and breath sounds normal. She has no wheezes. She has no rales.  Abdominal: Soft. Bowel sounds are normal. There is no tenderness.  Musculoskeletal: Normal range of motion. She exhibits no edema or tenderness.  W/c dependent. Chronic back pain is well managed with Tylenol 650mg  tid.   Lymphadenopathy:    She has no cervical adenopathy.  Neurological: She is alert and oriented to person, place, and time. She displays normal reflexes. No cranial nerve deficit. She exhibits normal muscle tone. Coordination normal.  Skin: Skin is warm and dry. No rash noted. She is not diaphoretic.  Psychiatric: Thought content normal. Her mood appears anxious. Her affect is inappropriate. Her speech is tangential. She is agitated and combative. Cognition and memory are impaired. She expresses impulsivity and inappropriate judgment. She exhibits a depressed mood. She exhibits abnormal recent memory and abnormal remote memory.    Labs reviewed: Basic Metabolic Panel: No results for input(s): NA, K, CL, CO2, GLUCOSE, BUN, CREATININE, CALCIUM, MG, PHOS in the last 8760 hours.  Liver Function Tests: No results for input(s): AST, ALT, ALKPHOS, BILITOT, PROT, ALBUMIN in the last 8760 hours.  CBC: No results for input(s): WBC, NEUTROABS, HGB, HCT, MCV, PLT in the last 8760 hours.  Lab Results  Component Value Date   TSH 1.48 07/26/2012   No results found for: HGBA1C Lab Results  Component Value Date    CHOL 222* 10/19/2007   HDL 102.5 10/19/2007   LDLDIRECT 103.3 10/19/2007   TRIG 60 10/19/2007   CHOLHDL 2.2 CALC 10/19/2007    Significant Diagnostic Results since last visit: none  Patient Care Team: Clent Jacks, MD as Consulting Physician (Ophthalmology) Gatha Mayer, MD as Consulting Physician (Gastroenterology) Nicholaus Bloom, MD as Consulting Physician (Pain Medicine)  Assessment/Plan Problem List Items Addressed This Visit    Constipation - Primary    Stable, continue MiraLax qod prn and Senokot S II qhs.       Dementia in chronic affective disorder (HCC)    Advanced stage, total dependent except feeding self sometimes, off meds, MCU for care needs, no behaviors problem. Off all meds       Esophageal reflux    Stable, continue Famotidine 20mg        Hx of seizure disorder    Continue to be seizure free.       Lower back pain    Controlled, takesTylenl 650mg  tid      Major depressive disorder, single episode  Mood is stable, off all meds          Family/ staff Communication: continue SNF MCU for care needs.   Labs/tests ordered:  none  ManXie Wayland Baik NP [facility] [address], [place] [address] On Call:  [phone] & follow prompts after 5pm & weekends Office Phone:  [phone] Office Fax:  [phone]

## 2015-07-27 NOTE — Assessment & Plan Note (Signed)
Advanced stage, total dependent except feeding self sometimes, off meds, MCU for care needs, no behaviors problem. Off all meds

## 2015-07-27 NOTE — Assessment & Plan Note (Signed)
Stable, continue MiraLax qod prn and Senokot S II qhs.

## 2015-08-17 ENCOUNTER — Encounter: Payer: Self-pay | Admitting: Internal Medicine

## 2015-08-17 ENCOUNTER — Non-Acute Institutional Stay (SKILLED_NURSING_FACILITY): Payer: Medicare Other | Admitting: Internal Medicine

## 2015-08-17 DIAGNOSIS — I10 Essential (primary) hypertension: Secondary | ICD-10-CM

## 2015-08-17 DIAGNOSIS — F320 Major depressive disorder, single episode, mild: Secondary | ICD-10-CM | POA: Diagnosis not present

## 2015-08-17 DIAGNOSIS — Z8669 Personal history of other diseases of the nervous system and sense organs: Secondary | ICD-10-CM | POA: Diagnosis not present

## 2015-08-17 DIAGNOSIS — C44310 Basal cell carcinoma of skin of unspecified parts of face: Secondary | ICD-10-CM | POA: Diagnosis not present

## 2015-08-17 DIAGNOSIS — F39 Unspecified mood [affective] disorder: Secondary | ICD-10-CM | POA: Diagnosis not present

## 2015-08-17 DIAGNOSIS — F0280 Dementia in other diseases classified elsewhere without behavioral disturbance: Secondary | ICD-10-CM

## 2015-08-17 NOTE — Progress Notes (Signed)
Patient ID: Veronica Sanchez, female   DOB: 07-20-1924, 81 y.o.   MRN: [id number]    [place] Room Number: [id number] of Service: SNF (31)     Allergies  Allergen Reactions  . Amoxicillin     REACTION: unspecified  . Celebrex [Celecoxib]   . Codeine Sulfate     REACTION: unspecified  . Naproxen Sodium     REACTION: unspecified  . Reminyl [Galantamine Hydrobromide]   . Versed [Midazolam]   . Vioxx [Rofecoxib]     Chief Complaint  Patient presents with  . Medical Management of Chronic Issues    HPI:  Dementia in chronic affective disorder (Veronica Sanchez) - unchanged  Essential hypertension - controlled  Hx of seizure disorder - no seizures for a long time  Major depressive disorder, single episode, mild (Veronica Sanchez) - some behavioral issues possibly linked to depression. Today she is somewhat withdrawn.  Basal cell carcinoma of face - unchanged   Medications: Patient's Medications  New Prescriptions   No medications on file  Previous Medications   ACETAMINOPHEN (TYLENOL) 650 MG SUPPOSITORY    Place 650 mg rectally 3 (three) times daily.   FAMOTIDINE (PEPCID) 20 MG TABLET    Take 20 mg by mouth daily. Take 1 tablet daily.   POLYETHYLENE GLYCOL (MIRALAX / GLYCOLAX) PACKET    Take 17 g by mouth daily. Fill cap to 17 gm mark, mix with 4 ounces of fluid and take by mouth daily.   SENNOSIDES-DOCUSATE SODIUM (SENOKOT-S) 8.6-50 MG TABLET    Take 2 tablets by mouth daily.  Modified Medications   No medications on file  Discontinued Medications   No medications on file     Review of Systems  Constitutional: Negative for fever, chills and diaphoresis.  HENT: Positive for hearing loss. Negative for congestion, ear discharge, ear pain, nosebleeds, sore throat and tinnitus.   Eyes: Negative for photophobia, pain, discharge and redness.  Respiratory: Negative for cough, shortness of breath, wheezing and stridor.   Cardiovascular: Negative for chest pain,  palpitations and leg swelling.  Gastrointestinal: Negative for nausea, vomiting, abdominal pain, diarrhea, constipation and blood in stool.  Endocrine: Negative for polydipsia.  Genitourinary: Positive for frequency. Negative for dysuria, urgency, hematuria and flank pain.       Urinary incontinence  Musculoskeletal: Positive for back pain. Negative for myalgias and neck pain.       W/c dependent  Skin: Negative for rash.       Growing lesions on both cheeks  Allergic/Immunologic: Negative for environmental allergies.  Neurological: Negative for dizziness, tremors, seizures, weakness and headaches.       Severe dementia  Hematological: Does not bruise/bleed easily.  Psychiatric/Behavioral: Positive for confusion and decreased concentration. Negative for suicidal ideas and hallucinations. The patient is nervous/anxious.     Filed Vitals:   08/17/15 1547  BP: 118/63  Pulse: 78  Temp: 96.7 F (35.9 C)  Resp: 14  Height: 5\' 1"  (1.549 m)  Weight: 127 lb 6.4 oz (57.788 kg)   Body mass index is 24.08 kg/(m^2).  Physical Exam  Constitutional: She is oriented to person, place, and time. She appears well-developed and well-nourished. No distress.  HENT:  Head: Normocephalic and atraumatic.  Eyes: Conjunctivae and EOM are normal. Pupils are equal, round, and reactive to light.  Neck: Normal range of motion. Neck supple. No JVD present. No thyromegaly present.  Cardiovascular: Normal rate and regular rhythm.   No murmur heard. Pulmonary/Chest: Effort normal and  breath sounds normal. She has no wheezes. She has no rales.  Abdominal: Soft. Bowel sounds are normal. There is no tenderness.  Musculoskeletal: Normal range of motion. She exhibits no edema or tenderness.  W/c dependent. Chronic back pain is well managed with Tylenol 650mg  tid.   Lymphadenopathy:    She has no cervical adenopathy.  Neurological: She is alert and oriented to person, place, and time. She displays normal reflexes.  No cranial nerve deficit. She exhibits normal muscle tone. Coordination normal.  02/10/2014 MMSE 9/30 History of agitation requiring Depakote. Patient is drowsy and complacent today. Difficult to get her to answer any questions.  Skin: Skin is warm and dry. No rash noted. She is not diaphoretic.  Basal cell cancer of the left cheek and possibly of the right upper cheek. Both lesions have a papular base and then umbilicated center. There is no bleeding or definite ulceration.  Psychiatric: Thought content normal. Her mood appears anxious. Her affect is inappropriate. Her speech is tangential. She is agitated and combative. Cognition and memory are impaired. She expresses impulsivity and inappropriate judgment. She exhibits a depressed mood. She exhibits abnormal recent memory and abnormal remote memory.     Labs reviewed: No flowsheet data found. Lab Results  Component Value Date   TSH 1.48 07/26/2012   Lab Results  Component Value Date   BUN 42* 07/26/2012   No results found for: HGBA1C     Assessment/Plan  1. Dementia in chronic affective disorder (HCC) Continue to observe on the memory care unit  2. Essential hypertension Controlled  3. Hx of seizure disorder Controlled  4. Major depressive disorder, single episode, mild (HCC) Unchanged  5. Basal cell carcinoma of face Unchanged

## 2015-08-24 ENCOUNTER — Non-Acute Institutional Stay (SKILLED_NURSING_FACILITY): Payer: Medicare Other | Admitting: Nurse Practitioner

## 2015-08-24 ENCOUNTER — Encounter: Payer: Self-pay | Admitting: Nurse Practitioner

## 2015-08-24 DIAGNOSIS — F320 Major depressive disorder, single episode, mild: Secondary | ICD-10-CM

## 2015-08-24 DIAGNOSIS — K5900 Constipation, unspecified: Secondary | ICD-10-CM

## 2015-08-24 DIAGNOSIS — F0280 Dementia in other diseases classified elsewhere without behavioral disturbance: Secondary | ICD-10-CM

## 2015-08-24 DIAGNOSIS — Z8669 Personal history of other diseases of the nervous system and sense organs: Secondary | ICD-10-CM | POA: Diagnosis not present

## 2015-08-24 DIAGNOSIS — K219 Gastro-esophageal reflux disease without esophagitis: Secondary | ICD-10-CM | POA: Diagnosis not present

## 2015-08-24 DIAGNOSIS — I10 Essential (primary) hypertension: Secondary | ICD-10-CM | POA: Diagnosis not present

## 2015-08-24 DIAGNOSIS — M5440 Lumbago with sciatica, unspecified side: Secondary | ICD-10-CM | POA: Diagnosis not present

## 2015-08-24 DIAGNOSIS — F39 Unspecified mood [affective] disorder: Secondary | ICD-10-CM | POA: Diagnosis not present

## 2015-08-24 NOTE — Assessment & Plan Note (Signed)
Controlled. Not taking antihypertensive agents.   

## 2015-08-24 NOTE — Assessment & Plan Note (Signed)
Stable, continue MiraLax qod prn and Senokot S II qhs.  

## 2015-08-24 NOTE — Assessment & Plan Note (Signed)
Stable, continue Famotidine 20mg  

## 2015-08-24 NOTE — Assessment & Plan Note (Signed)
Restlessness, mood is stabilized, continue Depakote.    

## 2015-08-24 NOTE — Assessment & Plan Note (Signed)
Advanced stage, total dependent except feeding self sometimes, off meds, MCU for care needs, no behaviors problem. Off all meds  

## 2015-08-24 NOTE — Assessment & Plan Note (Signed)
Continue to be seizure free.  

## 2015-08-24 NOTE — Progress Notes (Signed)
Patient ID: Veronica Sanchez, female   DOB: 1924/09/01, 81 y.o.   MRN: [id number]  Location:  SNF [facility] Provider:  Marlana Latus NP  Code Status:  DNR Goals of care: Advanced Directive information    Chief Complaint  Patient presents with  . Medical Management of Chronic Issues     HPI: Patient is a 81 y.o. female seen in the SNF at [facility] today for evaluation of chronic medical conditions. Hx of seizures, successfully weaned off life long antiepileptic agents. Hx of HTN, normal Bp, off meds. Her chronic back pain is better managed since she is no longer ambulating, scheduled Tylenol is adequate for pain management. She has end stage of dementia, total dependent of her ADLs except feeding self sometimes. Her mood is stable except sometimes she is agitated and combative due to her lack of understanding of surroundings.   Review of Systems:  Review of Systems  Constitutional: Negative for fever and chills.  HENT: Positive for hearing loss. Negative for congestion, ear discharge, ear pain, nosebleeds and sore throat.   Eyes: Negative for pain, discharge and redness.  Respiratory: Negative for cough, shortness of breath and wheezing.   Cardiovascular: Negative for chest pain, palpitations and leg swelling.  Gastrointestinal: Negative for nausea, vomiting, abdominal pain, diarrhea and constipation.  Genitourinary: Positive for frequency. Negative for dysuria, urgency and flank pain.       Urinary incontinence  Musculoskeletal: Positive for back pain. Negative for myalgias and neck pain.       W/c dependent  Skin: Negative for rash.  Neurological: Negative for dizziness, tremors, seizures, weakness and headaches.  Psychiatric/Behavioral: Positive for memory loss. Negative for suicidal ideas and hallucinations. The patient is nervous/anxious.     Past Medical History  Diagnosis Date  . Depression   . Hyperlipidemia   . Alzheimer's disease   . Hypertension   . GERD  (gastroesophageal reflux disease)   . Backache   . Spinal stenosis   . Colon polyp   . Diverticulosis   . Osteoarthritis   . Osteoporosis     07/23/09 x-ray of the lumbar and thoracic spine showed an L1/L2 mild anterior wedge deformity  . Seizure disorder ([place])   . Unstable gait   . Constipation 03/06/2013    03/19/14: Senokot S II qhs. Continue MiraLax qod     Patient Active Problem List   Diagnosis Date Noted  . Basal cell carcinoma of face 03/23/2015  . Hx of seizure disorder 12/04/2013  . DNR (do not resuscitate) 03/06/2013  . Constipation 03/06/2013  . Seasonal allergies 02/13/2013  . Major depressive disorder, single episode 12/26/2012  . Esophageal reflux 12/26/2012  . Lower back pain 10/19/2007  . HYPERLIPIDEMIA 06/20/2007  . Dementia in chronic affective disorder ([place]) 06/20/2007  . Essential hypertension 06/20/2007    Allergies  Allergen Reactions  . Amoxicillin     REACTION: unspecified  . Celebrex [Celecoxib]   . Codeine Sulfate     REACTION: unspecified  . Naproxen Sodium     REACTION: unspecified  . Reminyl [Galantamine Hydrobromide]   . Versed [Midazolam]   . Vioxx [Rofecoxib]     Medications: Patient's Medications  New Prescriptions   No medications on file  Previous Medications   ACETAMINOPHEN (TYLENOL) 650 MG SUPPOSITORY    Place 650 mg rectally 3 (three) times daily.   FAMOTIDINE (PEPCID) 20 MG TABLET    Take 20 mg by mouth daily. Take 1 tablet daily.   POLYETHYLENE GLYCOL (MIRALAX /  GLYCOLAX) PACKET    Take 17 g by mouth daily. Fill cap to 17 gm mark, mix with 4 ounces of fluid and take by mouth daily.   SENNOSIDES-DOCUSATE SODIUM (SENOKOT-S) 8.6-50 MG TABLET    Take 2 tablets by mouth daily.  Modified Medications   No medications on file  Discontinued Medications   No medications on file    Physical Exam: Filed Vitals:   08/24/15 1522  BP: 118/63  Pulse: 78  Temp: 96.7 F (35.9 C)  TempSrc: Tympanic  Resp: 14   There is no weight  on file to calculate BMI.  Physical Exam  Constitutional: She is oriented to person, place, and time. She appears well-developed and well-nourished. No distress.  HENT:  Head: Normocephalic and atraumatic.  Eyes: Conjunctivae and EOM are normal. Pupils are equal, round, and reactive to light.  Neck: Normal range of motion. Neck supple. No JVD present. No thyromegaly present.  Cardiovascular: Normal rate and regular rhythm.   No murmur heard. Pulmonary/Chest: Effort normal and breath sounds normal. She has no wheezes. She has no rales.  Abdominal: Soft. Bowel sounds are normal. There is no tenderness.  Musculoskeletal: Normal range of motion. She exhibits no edema or tenderness.  W/c dependent. Chronic back pain is well managed with Tylenol 650mg  tid.   Lymphadenopathy:    She has no cervical adenopathy.  Neurological: She is alert and oriented to person, place, and time. She displays normal reflexes. No cranial nerve deficit. She exhibits normal muscle tone. Coordination normal.  Skin: Skin is warm and dry. No rash noted. She is not diaphoretic.  Psychiatric: Thought content normal. Her mood appears anxious. Her affect is inappropriate. Her speech is tangential. She is agitated and combative. Cognition and memory are impaired. She expresses impulsivity and inappropriate judgment. She exhibits a depressed mood. She exhibits abnormal recent memory and abnormal remote memory.    Labs reviewed: Basic Metabolic Panel: No results for input(s): NA, K, CL, CO2, GLUCOSE, BUN, CREATININE, CALCIUM, MG, PHOS in the last 8760 hours.  Liver Function Tests: No results for input(s): AST, ALT, ALKPHOS, BILITOT, PROT, ALBUMIN in the last 8760 hours.  CBC: No results for input(s): WBC, NEUTROABS, HGB, HCT, MCV, PLT in the last 8760 hours.  Lab Results  Component Value Date   TSH 1.48 07/26/2012   No results found for: HGBA1C Lab Results  Component Value Date   CHOL 222* 10/19/2007   HDL 102.5  10/19/2007   LDLDIRECT 103.3 10/19/2007   TRIG 60 10/19/2007   CHOLHDL 2.2 CALC 10/19/2007    Significant Diagnostic Results since last visit: none  Patient Care Team: Clent Jacks, MD as Consulting Physician (Ophthalmology) Gatha Mayer, MD as Consulting Physician (Gastroenterology) Nicholaus Bloom, MD as Consulting Physician (Pain Medicine)  Assessment/Plan Problem List Items Addressed This Visit    Dementia in chronic affective disorder ([place]) - Primary (Chronic)    Advanced stage, total dependent except feeding self sometimes, off meds, MCU for care needs, no behaviors problem. Off all meds      Essential hypertension (Chronic)    Controlled. Not taking antihypertensive agents.       Major depressive disorder, single episode (Chronic)    Restlessness, mood is stabilized, continue Depakote.       Esophageal reflux (Chronic)    Stable, continue Famotidine 20mg       Hx of seizure disorder (Chronic)    Continue to be seizure free.       Lower back pain  Controlled, takesTylenl 650mg  tid      Constipation    Stable, continue MiraLax qod prn and Senokot S II qhs.           Family/ staff Communication: continue SNF MCU for care needs.   Labs/tests ordered:  none  ManXie Kumari Sculley NP [facility] [address], [place] [address] On Call:  [phone] & follow prompts after 5pm & weekends Office Phone:  [phone] Office Fax:  [phone]

## 2015-08-24 NOTE — Assessment & Plan Note (Signed)
Controlled, takesTylenl 650mg tid   

## 2015-09-07 ENCOUNTER — Non-Acute Institutional Stay (SKILLED_NURSING_FACILITY): Payer: Medicare Other | Admitting: Nurse Practitioner

## 2015-09-07 ENCOUNTER — Encounter: Payer: Self-pay | Admitting: Nurse Practitioner

## 2015-09-07 DIAGNOSIS — I10 Essential (primary) hypertension: Secondary | ICD-10-CM | POA: Diagnosis not present

## 2015-09-07 DIAGNOSIS — M5440 Lumbago with sciatica, unspecified side: Secondary | ICD-10-CM

## 2015-09-07 DIAGNOSIS — F39 Unspecified mood [affective] disorder: Secondary | ICD-10-CM | POA: Diagnosis not present

## 2015-09-07 DIAGNOSIS — Z8669 Personal history of other diseases of the nervous system and sense organs: Secondary | ICD-10-CM | POA: Diagnosis not present

## 2015-09-07 DIAGNOSIS — F320 Major depressive disorder, single episode, mild: Secondary | ICD-10-CM | POA: Diagnosis not present

## 2015-09-07 DIAGNOSIS — F0280 Dementia in other diseases classified elsewhere without behavioral disturbance: Secondary | ICD-10-CM | POA: Diagnosis not present

## 2015-09-07 DIAGNOSIS — K219 Gastro-esophageal reflux disease without esophagitis: Secondary | ICD-10-CM

## 2015-09-07 DIAGNOSIS — K5900 Constipation, unspecified: Secondary | ICD-10-CM

## 2015-09-07 NOTE — Assessment & Plan Note (Signed)
Restlessness, mood is stabilized, continue Depakote.    

## 2015-09-07 NOTE — Assessment & Plan Note (Signed)
Stable, continue MiraLax qod prn and Senokot S II qhs.  

## 2015-09-07 NOTE — Progress Notes (Signed)
Patient ID: Veronica Sanchez, female   DOB: 10/02/1923, 81 y.o.   MRN: [id number]  Location:  SNF [facility] Provider:  Marlana Latus NP  Code Status:  DNR Goals of care: Advanced Directive information    Chief Complaint  Patient presents with  . Medical Management of Chronic Issues     HPI: Patient is a 81 y.o. female seen in the SNF at [facility] today for evaluation of Hx of seizures, successfully weaned off life long antiepileptic agents. Hx of HTN, normal Bp, off meds. Her chronic back pain is better managed since she is no longer ambulating, scheduled Tylenol is adequate for pain management. She has end stage of dementia, total dependent of her ADLs except feeding self sometimes. Her mood is stable except sometimes she is agitated and combative due to her lack of understanding of surroundings.   Review of Systems:  Review of Systems  Constitutional: Negative for fever and chills.  HENT: Positive for hearing loss. Negative for congestion, ear discharge, ear pain, nosebleeds and sore throat.   Eyes: Negative for pain, discharge and redness.  Respiratory: Negative for cough, shortness of breath and wheezing.   Cardiovascular: Negative for chest pain, palpitations and leg swelling.  Gastrointestinal: Negative for nausea, vomiting, abdominal pain, diarrhea and constipation.  Genitourinary: Positive for frequency. Negative for dysuria, urgency and flank pain.       Urinary incontinence  Musculoskeletal: Positive for back pain. Negative for myalgias and neck pain.       W/c dependent  Skin: Negative for rash.  Neurological: Negative for dizziness, tremors, seizures, weakness and headaches.  Psychiatric/Behavioral: Positive for memory loss. Negative for suicidal ideas and hallucinations. The patient is nervous/anxious.     Past Medical History  Diagnosis Date  . Depression   . Hyperlipidemia   . Alzheimer's disease   . Hypertension   . GERD (gastroesophageal reflux disease)   .  Backache   . Spinal stenosis   . Colon polyp   . Diverticulosis   . Osteoarthritis   . Osteoporosis     07/23/09 x-ray of the lumbar and thoracic spine showed an L1/L2 mild anterior wedge deformity  . Seizure disorder ([place])   . Unstable gait   . Constipation 03/06/2013    03/19/14: Senokot S II qhs. Continue MiraLax qod     Patient Active Problem List   Diagnosis Date Noted  . Basal cell carcinoma of face 03/23/2015  . Hx of seizure disorder 12/04/2013  . DNR (do not resuscitate) 03/06/2013  . Constipation 03/06/2013  . Seasonal allergies 02/13/2013  . Major depressive disorder, single episode 12/26/2012  . Esophageal reflux 12/26/2012  . Lower back pain 10/19/2007  . HYPERLIPIDEMIA 06/20/2007  . Dementia in chronic affective disorder ([place]) 06/20/2007  . Essential hypertension 06/20/2007    Allergies  Allergen Reactions  . Amoxicillin     REACTION: unspecified  . Celebrex [Celecoxib]   . Codeine Sulfate     REACTION: unspecified  . Naproxen Sodium     REACTION: unspecified  . Reminyl [Galantamine Hydrobromide]   . Versed [Midazolam]   . Vioxx [Rofecoxib]     Medications: Patient's Medications  New Prescriptions   No medications on file  Previous Medications   ACETAMINOPHEN (TYLENOL) 650 MG SUPPOSITORY    Place 650 mg rectally 3 (three) times daily.   FAMOTIDINE (PEPCID) 20 MG TABLET    Take 20 mg by mouth daily. Take 1 tablet daily.   POLYETHYLENE GLYCOL (MIRALAX / GLYCOLAX) PACKET  Take 17 g by mouth daily. Fill cap to 17 gm mark, mix with 4 ounces of fluid and take by mouth daily.   SENNOSIDES-DOCUSATE SODIUM (SENOKOT-S) 8.6-50 MG TABLET    Take 2 tablets by mouth daily.  Modified Medications   No medications on file  Discontinued Medications   No medications on file    Physical Exam: Filed Vitals:   09/07/15 1450  BP: 109/67  Pulse: 88  Temp: 98.2 F (36.8 C)  TempSrc: Tympanic  Resp: 20   There is no weight on file to calculate BMI.  Physical  Exam  Constitutional: She is oriented to person, place, and time. She appears well-developed and well-nourished. No distress.  HENT:  Head: Normocephalic and atraumatic.  Eyes: Conjunctivae and EOM are normal. Pupils are equal, round, and reactive to light.  Neck: Normal range of motion. Neck supple. No JVD present. No thyromegaly present.  Cardiovascular: Normal rate and regular rhythm.   No murmur heard. Pulmonary/Chest: Effort normal and breath sounds normal. She has no wheezes. She has no rales.  Abdominal: Soft. Bowel sounds are normal. There is no tenderness.  Musculoskeletal: Normal range of motion. She exhibits no edema or tenderness.  W/c dependent. Chronic back pain is well managed with Tylenol 650mg  tid.   Lymphadenopathy:    She has no cervical adenopathy.  Neurological: She is alert and oriented to person, place, and time. She displays normal reflexes. No cranial nerve deficit. She exhibits normal muscle tone. Coordination normal.  Skin: Skin is warm and dry. No rash noted. She is not diaphoretic.  Psychiatric: Thought content normal. Her mood appears anxious. Her affect is inappropriate. Her speech is tangential. She is agitated and combative. Cognition and memory are impaired. She expresses impulsivity and inappropriate judgment. She exhibits a depressed mood. She exhibits abnormal recent memory and abnormal remote memory.    Labs reviewed: Basic Metabolic Panel: No results for input(s): NA, K, CL, CO2, GLUCOSE, BUN, CREATININE, CALCIUM, MG, PHOS in the last 8760 hours.  Liver Function Tests: No results for input(s): AST, ALT, ALKPHOS, BILITOT, PROT, ALBUMIN in the last 8760 hours.  CBC: No results for input(s): WBC, NEUTROABS, HGB, HCT, MCV, PLT in the last 8760 hours.  Lab Results  Component Value Date   TSH 1.48 07/26/2012   No results found for: HGBA1C Lab Results  Component Value Date   CHOL 222* 10/19/2007   HDL 102.5 10/19/2007   LDLDIRECT 103.3 10/19/2007     TRIG 60 10/19/2007   CHOLHDL 2.2 CALC 10/19/2007    Significant Diagnostic Results since last visit: none  Patient Care Team: Clent Jacks, MD as Consulting Physician (Ophthalmology) Gatha Mayer, MD as Consulting Physician (Gastroenterology) Nicholaus Bloom, MD as Consulting Physician (Pain Medicine)  Assessment/Plan Problem List Items Addressed This Visit    Constipation - Primary    Stable, continue MiraLax qod prn and Senokot S II qhs.       Dementia in chronic affective disorder (HCC) (Chronic)    Advanced stage, total dependent except feeding self sometimes, off meds, MCU for care needs, no behaviors problem. Off all meds      Esophageal reflux (Chronic)    Stable, dc Famotidine 20mg       Essential hypertension (Chronic)    Controlled. Not taking antihypertensive agents.       Hx of seizure disorder (Chronic)    Continue to be seizure free.       Lower back pain    Controlled, takesTylenl 650mg  tid  Major depressive disorder, single episode (Chronic)    Restlessness, mood is stabilized, continue Depakote.           Family/ staff Communication: continue SNF MCU for care needs.   Labs/tests ordered:  none  ManXie Dominique Calvey NP [facility] [address], [place] [address] On Call:  [phone] & follow prompts after 5pm & weekends Office Phone:  [phone] Office Fax:  [phone]

## 2015-09-07 NOTE — Assessment & Plan Note (Signed)
Stable, dc Famotidine 20mg 

## 2015-09-07 NOTE — Assessment & Plan Note (Signed)
Controlled, takesTylenl 650mg tid   

## 2015-09-07 NOTE — Assessment & Plan Note (Signed)
Advanced stage, total dependent except feeding self sometimes, off meds, MCU for care needs, no behaviors problem. Off all meds  

## 2015-09-07 NOTE — Assessment & Plan Note (Signed)
Controlled. Not taking antihypertensive agents.   

## 2015-09-07 NOTE — Assessment & Plan Note (Signed)
Continue to be seizure free.  

## 2015-10-14 ENCOUNTER — Non-Acute Institutional Stay (SKILLED_NURSING_FACILITY): Payer: Medicare Other | Admitting: Nurse Practitioner

## 2015-10-14 ENCOUNTER — Encounter: Payer: Self-pay | Admitting: Nurse Practitioner

## 2015-10-14 DIAGNOSIS — H11001 Unspecified pterygium of right eye: Secondary | ICD-10-CM | POA: Diagnosis not present

## 2015-10-14 DIAGNOSIS — F39 Unspecified mood [affective] disorder: Secondary | ICD-10-CM | POA: Diagnosis not present

## 2015-10-14 DIAGNOSIS — Z8669 Personal history of other diseases of the nervous system and sense organs: Secondary | ICD-10-CM

## 2015-10-14 DIAGNOSIS — K219 Gastro-esophageal reflux disease without esophagitis: Secondary | ICD-10-CM

## 2015-10-14 DIAGNOSIS — F0280 Dementia in other diseases classified elsewhere without behavioral disturbance: Secondary | ICD-10-CM | POA: Diagnosis not present

## 2015-10-14 DIAGNOSIS — H1131 Conjunctival hemorrhage, right eye: Secondary | ICD-10-CM

## 2015-10-14 DIAGNOSIS — K5900 Constipation, unspecified: Secondary | ICD-10-CM

## 2015-10-14 DIAGNOSIS — M5440 Lumbago with sciatica, unspecified side: Secondary | ICD-10-CM | POA: Diagnosis not present

## 2015-10-14 DIAGNOSIS — I10 Essential (primary) hypertension: Secondary | ICD-10-CM | POA: Diagnosis not present

## 2015-10-14 HISTORY — DX: Unspecified pterygium of right eye: H11.001

## 2015-10-14 NOTE — Assessment & Plan Note (Signed)
Continue to be seizure free.  

## 2015-10-14 NOTE — Assessment & Plan Note (Signed)
Medial aspect, denied pain or vision changes, continue to observe, should heal.

## 2015-10-14 NOTE — Assessment & Plan Note (Signed)
Medical aspect of the right eye, observe.

## 2015-10-14 NOTE — Progress Notes (Signed)
Patient ID: Veronica Sanchez, female   DOB: [birth year], 82 y.o.   MRN: [id number]  Location:  [facility] Provider:  Marlana Latus NP  Code Status:  DNR Goals of care: Advanced Directive information    Chief Complaint  Patient presents with  . Medical Management of Chronic Issues     HPI: Patient is a 82 y.o. female seen in the SNF at [facility] today for evaluation of medial right eye subconjunctival hemorrhage, small pterygium,  Hx of seizures, successfully weaned off life long antiepileptic agents. Hx of HTN, normal Bp, off meds. Her chronic back pain is better managed since she is no longer ambulating, scheduled Tylenol is adequate for pain management. She has end stage of dementia, total dependent of her ADLs except feeding self sometimes. Her mood is stable except sometimes she is agitated and combative due to her lack of understanding of surroundings.   Review of Systems:  Review of Systems  Constitutional: Negative for fever and chills.  HENT: Positive for hearing loss. Negative for congestion, ear discharge, ear pain, nosebleeds and sore throat.   Eyes: Positive for redness. Negative for pain and discharge.       Medial right eye subconjunctival hemorrhage, small pterygium  Respiratory: Negative for cough, shortness of breath and wheezing.   Cardiovascular: Negative for chest pain, palpitations and leg swelling.  Gastrointestinal: Negative for nausea, vomiting, abdominal pain, diarrhea and constipation.  Genitourinary: Positive for frequency. Negative for dysuria, urgency and flank pain.       Urinary incontinence  Musculoskeletal: Positive for back pain. Negative for myalgias and neck pain.       W/c dependent  Skin: Negative for rash.  Neurological: Negative for dizziness, tremors, seizures, weakness and headaches.  Psychiatric/Behavioral: Positive for memory loss. Negative for suicidal ideas and hallucinations. The patient is nervous/anxious.     Past Medical History    Diagnosis Date  . Depression   . Hyperlipidemia   . Alzheimer's disease   . Hypertension   . GERD (gastroesophageal reflux disease)   . Backache   . Spinal stenosis   . Colon polyp   . Diverticulosis   . Osteoarthritis   . Osteoporosis     07/23/09 x-ray of the lumbar and thoracic spine showed an L1/L2 mild anterior wedge deformity  . Seizure disorder ([place])   . Unstable gait   . Constipation 03/06/2013    03/19/14: Senokot S II qhs. Continue MiraLax qod     Patient Active Problem List   Diagnosis Date Noted  . Subconjunctival hemorrhage of right eye 10/14/2015  . Pterygium of right eye 10/14/2015  . Basal cell carcinoma of face 03/23/2015  . Hx of seizure disorder 12/04/2013  . DNR (do not resuscitate) 03/06/2013  . Constipation 03/06/2013  . Seasonal allergies 02/13/2013  . Major depressive disorder, single episode 12/26/2012  . Esophageal reflux 12/26/2012  . Lower back pain 10/19/2007  . HYPERLIPIDEMIA 06/20/2007  . Dementia in chronic affective disorder ([place]) 06/20/2007  . Essential hypertension 06/20/2007    Allergies  Allergen Reactions  . Amoxicillin     REACTION: unspecified  . Celebrex [Celecoxib]   . Codeine Sulfate     REACTION: unspecified  . Naproxen Sodium     REACTION: unspecified  . Reminyl [Galantamine Hydrobromide]   . Versed [Midazolam]   . Vioxx [Rofecoxib]     Medications: Patient's Medications  New Prescriptions   No medications on file  Previous Medications   ACETAMINOPHEN (TYLENOL) 650 MG SUPPOSITORY  Place 650 mg rectally 3 (three) times daily.   FAMOTIDINE (PEPCID) 20 MG TABLET    Take 20 mg by mouth daily. Take 1 tablet daily.   POLYETHYLENE GLYCOL (MIRALAX / GLYCOLAX) PACKET    Take 17 g by mouth daily. Fill cap to 17 gm mark, mix with 4 ounces of fluid and take by mouth daily.   SENNOSIDES-DOCUSATE SODIUM (SENOKOT-S) 8.6-50 MG TABLET    Take 2 tablets by mouth daily.  Modified Medications   No medications on file   Discontinued Medications   No medications on file    Physical Exam: Filed Vitals:   10/14/15 1146  BP: 110/78  Pulse: 68  Temp: 97.6 F (36.4 C)  TempSrc: Tympanic  Resp: 18   There is no weight on file to calculate BMI.  Physical Exam  Constitutional: She is oriented to person, place, and time. She appears well-developed and well-nourished. No distress.  HENT:  Head: Normocephalic and atraumatic.  Eyes: EOM are normal. Pupils are equal, round, and reactive to light.  medial right eye subconjunctival hemorrhage, small pterygium  Neck: Normal range of motion. Neck supple. No JVD present. No thyromegaly present.  Cardiovascular: Normal rate and regular rhythm.   No murmur heard. Pulmonary/Chest: Effort normal and breath sounds normal. She has no wheezes. She has no rales.  Abdominal: Soft. Bowel sounds are normal. There is no tenderness.  Musculoskeletal: Normal range of motion. She exhibits no edema or tenderness.  W/c dependent. Chronic back pain is well managed with Tylenol 650mg  tid.   Lymphadenopathy:    She has no cervical adenopathy.  Neurological: She is alert and oriented to person, place, and time. She displays normal reflexes. No cranial nerve deficit. She exhibits normal muscle tone. Coordination normal.  Skin: Skin is warm and dry. No rash noted. She is not diaphoretic.  Psychiatric: Thought content normal. Her mood appears anxious. Her affect is inappropriate. Her speech is tangential. She is agitated and combative. Cognition and memory are impaired. She expresses impulsivity and inappropriate judgment. She exhibits a depressed mood. She exhibits abnormal recent memory and abnormal remote memory.    Labs reviewed: Basic Metabolic Panel: No results for input(s): NA, K, CL, CO2, GLUCOSE, BUN, CREATININE, CALCIUM, MG, PHOS in the last 8760 hours.  Liver Function Tests: No results for input(s): AST, ALT, ALKPHOS, BILITOT, PROT, ALBUMIN in the last 8760  hours.  CBC: No results for input(s): WBC, NEUTROABS, HGB, HCT, MCV, PLT in the last 8760 hours.  Lab Results  Component Value Date   TSH 1.48 07/26/2012   No results found for: HGBA1C Lab Results  Component Value Date   CHOL 222* 10/19/2007   HDL 102.5 10/19/2007   LDLDIRECT 103.3 10/19/2007   TRIG 60 10/19/2007   CHOLHDL 2.2 CALC 10/19/2007    Significant Diagnostic Results since last visit: none  Patient Care Team: Clent Jacks, MD as Consulting Physician (Ophthalmology) Gatha Mayer, MD as Consulting Physician (Gastroenterology) Nicholaus Bloom, MD as Consulting Physician (Pain Medicine)  Assessment/Plan Problem List Items Addressed This Visit    Subconjunctival hemorrhage of right eye    Medial aspect, denied pain or vision changes, continue to observe, should heal.       Pterygium of right eye    Medical aspect of the right eye, observe.       Lower back pain    Controlled, takesTylenl 650mg  tid      Hx of seizure disorder (Chronic)    Continue to be seizure free.  Essential hypertension (Chronic)    Controlled. Not taking antihypertensive agents.       Esophageal reflux (Chronic)    Stable, off Famotidine 20mg       Dementia in chronic affective disorder (HCC) (Chronic)     mood is stabilized, off meds      Constipation - Primary    Stable, continue MiraLax qod prn and Senokot S II qhs.           Family/ staff Communication: continue SNF MCU for care needs.   Labs/tests ordered:  none  ManXie Jadae Steinke NP [facility] [address], [place] [address] On Call:  [phone] & follow prompts after 5pm & weekends Office Phone:  [phone] Office Fax:  [phone]

## 2015-10-14 NOTE — Assessment & Plan Note (Signed)
Stable, off Famotidine 20mg  

## 2015-10-14 NOTE — Assessment & Plan Note (Signed)
Controlled, takesTylenl 650mg tid   

## 2015-10-14 NOTE — Assessment & Plan Note (Signed)
Controlled. Not taking antihypertensive agents.   

## 2015-10-14 NOTE — Assessment & Plan Note (Signed)
Stable, continue MiraLax qod prn and Senokot S II qhs.  

## 2015-10-14 NOTE — Assessment & Plan Note (Signed)
mood is stabilized, off meds 

## 2015-11-09 ENCOUNTER — Non-Acute Institutional Stay (SKILLED_NURSING_FACILITY): Payer: Medicare Other | Admitting: Nurse Practitioner

## 2015-11-09 ENCOUNTER — Encounter: Payer: Self-pay | Admitting: Nurse Practitioner

## 2015-11-09 DIAGNOSIS — K219 Gastro-esophageal reflux disease without esophagitis: Secondary | ICD-10-CM | POA: Diagnosis not present

## 2015-11-09 DIAGNOSIS — M5440 Lumbago with sciatica, unspecified side: Secondary | ICD-10-CM | POA: Diagnosis not present

## 2015-11-09 DIAGNOSIS — F39 Unspecified mood [affective] disorder: Secondary | ICD-10-CM | POA: Diagnosis not present

## 2015-11-09 DIAGNOSIS — F0280 Dementia in other diseases classified elsewhere without behavioral disturbance: Secondary | ICD-10-CM

## 2015-11-09 DIAGNOSIS — K5900 Constipation, unspecified: Secondary | ICD-10-CM | POA: Diagnosis not present

## 2015-11-09 DIAGNOSIS — Z8669 Personal history of other diseases of the nervous system and sense organs: Secondary | ICD-10-CM

## 2015-11-09 DIAGNOSIS — F324 Major depressive disorder, single episode, in partial remission: Secondary | ICD-10-CM | POA: Diagnosis not present

## 2015-11-09 DIAGNOSIS — I10 Essential (primary) hypertension: Secondary | ICD-10-CM

## 2015-11-09 NOTE — Assessment & Plan Note (Signed)
Continue to be seizure free.  

## 2015-11-09 NOTE — Assessment & Plan Note (Signed)
mood is stabilized, off meds 

## 2015-11-09 NOTE — Assessment & Plan Note (Signed)
Mood is stable, except occasional outburst but easily redirected.

## 2015-11-09 NOTE — Assessment & Plan Note (Signed)
Controlled. Not taking antihypertensive agents.   

## 2015-11-09 NOTE — Assessment & Plan Note (Signed)
Stable, off Famotidine 20mg  

## 2015-11-09 NOTE — Assessment & Plan Note (Signed)
Stable, continue MiraLax qod prn and Senokot S II qhs.  

## 2015-11-09 NOTE — Progress Notes (Signed)
Location:  SNF [facility] Provider:  Marlana Latus NP  Code Status:  DNR Goals of care: Advanced Directive information Does patient have an advance directive?: Yes, Type of Advance Directive: [place];Out of facility DNR (pink MOST or yellow form)  Chief Complaint  Patient presents with  . Medical Management of Chronic Issues    Routine Visit     HPI: Patient is a 82 y.o. female seen in the SNF at [facility] today for evaluation of Hx of dementia, seizure, lower back pain.   Review of Systems:  Review of Systems  Constitutional: Negative for fever and chills.  HENT: Positive for hearing loss. Negative for congestion, ear discharge, ear pain, nosebleeds and sore throat.   Eyes: Negative for pain, discharge and redness.       Small pterygium  Respiratory: Negative for cough, shortness of breath and wheezing.   Cardiovascular: Negative for chest pain, palpitations and leg swelling.  Gastrointestinal: Negative for nausea, vomiting, abdominal pain, diarrhea and constipation.  Genitourinary: Positive for frequency. Negative for dysuria, urgency and flank pain.       Urinary incontinence  Musculoskeletal: Positive for back pain. Negative for myalgias and neck pain.       W/c dependent  Skin: Negative for rash.  Neurological: Negative for dizziness, tremors, seizures, weakness and headaches.  Psychiatric/Behavioral: Positive for memory loss. Negative for suicidal ideas and hallucinations. The patient is nervous/anxious.     Past Medical History  Diagnosis Date  . Depression   . Hyperlipidemia   . Alzheimer's disease   . Hypertension   . GERD (gastroesophageal reflux disease)   . Backache   . Spinal stenosis   . Colon polyp   . Diverticulosis   . Osteoarthritis   . Osteoporosis     07/23/09 x-ray of the lumbar and thoracic spine showed an L1/L2 mild anterior wedge deformity  . Seizure disorder ([place])   . Unstable gait   . Constipation 03/06/2013   03/19/14: Senokot S II qhs. Continue MiraLax qod     Patient Active Problem List   Diagnosis Date Noted  . Subconjunctival hemorrhage of right eye 10/14/2015  . Pterygium of right eye 10/14/2015  . Basal cell carcinoma of face 03/23/2015  . Hx of seizure disorder 12/04/2013  . DNR (do not resuscitate) 03/06/2013  . Constipation 03/06/2013  . Seasonal allergies 02/13/2013  . Major depressive disorder, single episode 12/26/2012  . Esophageal reflux 12/26/2012  . Lower back pain 10/19/2007  . HYPERLIPIDEMIA 06/20/2007  . Dementia in chronic affective disorder ([place]) 06/20/2007  . Essential hypertension 06/20/2007    Allergies  Allergen Reactions  . Amoxicillin     REACTION: unspecified  . Celebrex [Celecoxib]   . Codeine Sulfate     REACTION: unspecified  . Naproxen Sodium     REACTION: unspecified  . Reminyl [Galantamine Hydrobromide]   . Versed [Midazolam]   . Vioxx [Rofecoxib]     Medications: Patient's Medications  New Prescriptions   No medications on file  Previous Medications   ACETAMINOPHEN (TYLENOL) 325 MG TABLET    Take 650 mg by mouth 3 (three) times daily. Take 2 tabs by mouth daily as needed for pain ( not to exceed 3 gm)   FEEDING SUPPLEMENT (BOOST / RESOURCE BREEZE) LIQD    Take 1 Container by mouth. 90 ML by mouth twice daily   POLYETHYLENE GLYCOL (MIRALAX / GLYCOLAX) PACKET    Take 17 g by mouth daily. Fill cap to 17 gm  mark, mix with 4 ounces of fluid and take by mouth daily.   SENNOSIDES-DOCUSATE SODIUM (SENOKOT-S) 8.6-50 MG TABLET    Take 2 tablets by mouth daily.  Modified Medications   No medications on file  Discontinued Medications   ACETAMINOPHEN (TYLENOL) 650 MG SUPPOSITORY    Place 650 mg rectally 3 (three) times daily.   FAMOTIDINE (PEPCID) 20 MG TABLET    Take 20 mg by mouth daily. Take 1 tablet daily.    Physical Exam: Filed Vitals:   11/09/15 1009  BP: 118/64  Pulse: 83  Temp: 97.3 F (36.3 C)  TempSrc: Oral  Resp: 18  Height: 5\' 1"   (1.549 m)  Weight: 125 lb 9.6 oz (56.972 kg)   Body mass index is 23.74 kg/(m^2).  Physical Exam  Constitutional: She is oriented to person, place, and time. She appears well-developed and well-nourished. No distress.  HENT:  Head: Normocephalic and atraumatic.  Eyes: EOM are normal. Pupils are equal, round, and reactive to light.  small pterygium  Neck: Normal range of motion. Neck supple. No JVD present. No thyromegaly present.  Cardiovascular: Normal rate and regular rhythm.   No murmur heard. Pulmonary/Chest: Effort normal and breath sounds normal. She has no wheezes. She has no rales.  Abdominal: Soft. Bowel sounds are normal. There is no tenderness.  Musculoskeletal: Normal range of motion. She exhibits no edema or tenderness.  W/c dependent. Chronic back pain is well managed with Tylenol 650mg  tid.   Lymphadenopathy:    She has no cervical adenopathy.  Neurological: She is alert and oriented to person, place, and time. She displays normal reflexes. No cranial nerve deficit. She exhibits normal muscle tone. Coordination normal.  Skin: Skin is warm and dry. No rash noted. She is not diaphoretic.  Psychiatric: Thought content normal. Her mood appears anxious. Her affect is inappropriate. Her speech is tangential. She is agitated and combative. Cognition and memory are impaired. She expresses impulsivity and inappropriate judgment. She exhibits a depressed mood. She exhibits abnormal recent memory and abnormal remote memory.    Labs reviewed: Basic Metabolic Panel: No results for input(s): NA, K, CL, CO2, GLUCOSE, BUN, CREATININE, CALCIUM, MG, PHOS in the last 8760 hours.  Liver Function Tests: No results for input(s): AST, ALT, ALKPHOS, BILITOT, PROT, ALBUMIN in the last 8760 hours.  CBC: No results for input(s): WBC, NEUTROABS, HGB, HCT, MCV, PLT in the last 8760 hours.  Lab Results  Component Value Date   TSH 1.48 07/26/2012   No results found for: HGBA1C Lab Results    Component Value Date   CHOL 222* 10/19/2007   HDL 102.5 10/19/2007   LDLDIRECT 103.3 10/19/2007   TRIG 60 10/19/2007   CHOLHDL 2.2 CALC 10/19/2007    Significant Diagnostic Results since last visit: none  Patient Care Team: Clent Jacks, MD as Consulting Physician (Ophthalmology) Gatha Mayer, MD as Consulting Physician (Gastroenterology) Nicholaus Bloom, MD as Consulting Physician (Pain Medicine)  Assessment/Plan Problem List Items Addressed This Visit    Dementia in chronic affective disorder ([place]) - Primary (Chronic)     mood is stabilized, off meds      Essential hypertension (Chronic)    Controlled. Not taking antihypertensive agents.      Major depressive disorder, single episode (Chronic)    Mood is stable, except occasional outburst but easily redirected.       Esophageal reflux (Chronic)    Stable, off Famotidine 20mg       Hx of seizure disorder (Chronic)  Continue to be seizure free.       Lower back pain    Controlled, takesTylenl 650mg  tid      Relevant Medications   acetaminophen (TYLENOL) 325 MG tablet   Constipation    Stable, continue MiraLax qod prn and Senokot S II qhs.           Family/ staff Communication: continue SNF  Labs/tests ordered: none  [facility] Jeniffer Culliver NP [facility] [address] [place], [place] [address] On Call:  [phone] & follow prompts after 5pm & weekends Office Phone:  [phone] Office Fax:  [phone]

## 2015-11-09 NOTE — Assessment & Plan Note (Signed)
Controlled, takesTylenl 650mg tid   

## 2015-12-07 ENCOUNTER — Encounter: Payer: Self-pay | Admitting: Nurse Practitioner

## 2015-12-07 ENCOUNTER — Non-Acute Institutional Stay (SKILLED_NURSING_FACILITY): Payer: Medicare Other | Admitting: Nurse Practitioner

## 2015-12-07 DIAGNOSIS — I10 Essential (primary) hypertension: Secondary | ICD-10-CM

## 2015-12-07 DIAGNOSIS — F0280 Dementia in other diseases classified elsewhere without behavioral disturbance: Secondary | ICD-10-CM

## 2015-12-07 DIAGNOSIS — K5900 Constipation, unspecified: Secondary | ICD-10-CM

## 2015-12-07 DIAGNOSIS — F324 Major depressive disorder, single episode, in partial remission: Secondary | ICD-10-CM | POA: Diagnosis not present

## 2015-12-07 DIAGNOSIS — Z8669 Personal history of other diseases of the nervous system and sense organs: Secondary | ICD-10-CM

## 2015-12-07 DIAGNOSIS — F39 Unspecified mood [affective] disorder: Secondary | ICD-10-CM | POA: Diagnosis not present

## 2015-12-07 DIAGNOSIS — K219 Gastro-esophageal reflux disease without esophagitis: Secondary | ICD-10-CM | POA: Diagnosis not present

## 2015-12-07 DIAGNOSIS — M5440 Lumbago with sciatica, unspecified side: Secondary | ICD-10-CM | POA: Diagnosis not present

## 2015-12-07 NOTE — Assessment & Plan Note (Signed)
Stable, continue MiraLax qod prn and Senokot S II qhs.  

## 2015-12-07 NOTE — Progress Notes (Signed)
Patient ID: Veronica Sanchez, female   DOB: 1923/10/06, 82 y.o.   MRN: [id number]  Location:  [place] Room Number: 102 Place of Service:  SNF (31) Provider: Marlana Latus NP  No primary care provider on file.  Patient Care Team: Clent Jacks, MD as Consulting Physician (Ophthalmology) Gatha Mayer, MD as Consulting Physician (Gastroenterology) Nicholaus Bloom, MD as Consulting Physician (Pain Medicine)  Extended Emergency Contact Information Primary Emergency Contact: [facility] Address: [place]          [place]  [place] Home Phone: [phone] Relation: None  Code Status:  DNR Goals of care: Advanced Directive information Advanced Directives 12/07/2015  Does patient have an advance directive? Yes  Type of Paramedic of [place];Living will;Out of facility DNR (pink MOST or yellow form)  Copy of advanced directive(s) in chart? Yes     Chief Complaint  Patient presents with  . Medical Management of Chronic Issues    Routine Visit    HPI:  Pt is a 82 y.o. female seen today for medical management of chronic diseases.  Hx of seizures, successfully weaned off life long antiepileptic agents. Hx of HTN, normal Bp, off meds. Her chronic back pain is better managed since she is no longer ambulating, scheduled Tylenol is adequate for pain management. She has end stage of dementia, total dependent of her ADLs except feeding self sometimes. Her mood is stable except sometimes she is agitated and combative due to her lack of understanding of surroundings.     Past Medical History  Diagnosis Date  . Depression   . Hyperlipidemia   . Alzheimer's disease   . Hypertension   . GERD (gastroesophageal reflux disease)   . Backache   . Spinal stenosis   . Colon polyp   . Diverticulosis   . Osteoarthritis   . Osteoporosis     07/23/09 x-ray of the lumbar and thoracic spine showed an L1/L2 mild anterior wedge deformity  .  Seizure disorder ([place])   . Unstable gait   . Constipation 03/06/2013    03/19/14: Senokot S II qhs. Continue MiraLax qod    Past Surgical History  Procedure Laterality Date  . Tonsillectomy and adenoidectomy Bilateral 1933  . Cataract extraction      Dr. Katy Fitch  . Colonoscopy  02/01/2002    AVM of the ascending colon, 3 mm polyp, AVM and transverse colon, diverticulosis  . Esophagogastroduodenoscopy  07/05/2005    Esophageal stricture, 4 cm hiatal hernia  . Epidural block injection  2007    Allergies  Allergen Reactions  . Amoxicillin     REACTION: unspecified  . Celebrex [Celecoxib]   . Codeine Sulfate     REACTION: unspecified  . Naproxen Sodium     REACTION: unspecified  . Reminyl [Galantamine Hydrobromide]   . Versed [Midazolam]   . Vioxx [Rofecoxib]       Medication List       This list is accurate as of: 12/07/15 11:59 PM.  Always use your most recent med list.               acetaminophen 325 MG tablet  Commonly known as:  TYLENOL  Take 650 mg by mouth 3 (three) times daily. Take 2 tabs by mouth daily as needed for pain ( not to exceed 3 gm)     feeding supplement Liqd  Take 1 Container by mouth. 90 ML by mouth twice daily  polyethylene glycol packet  Commonly known as:  MIRALAX / GLYCOLAX  Take 17 g by mouth daily. Fill cap to 17 gm mark, mix with 4 ounces of fluid and take by mouth daily.     sennosides-docusate sodium 8.6-50 MG tablet  Commonly known as:  SENOKOT-S  Take 2 tablets by mouth daily.        Review of Systems  Constitutional: Negative for fever and chills.  HENT: Positive for hearing loss. Negative for congestion, ear discharge, ear pain, nosebleeds and sore throat.   Eyes: Negative for pain, discharge and redness.       Small pterygium  Respiratory: Negative for cough, shortness of breath and wheezing.   Cardiovascular: Negative for chest pain, palpitations and leg swelling.  Gastrointestinal: Negative for nausea, vomiting,  abdominal pain, diarrhea and constipation.  Genitourinary: Positive for frequency. Negative for dysuria, urgency and flank pain.       Urinary incontinence  Musculoskeletal: Positive for back pain. Negative for myalgias and neck pain.       W/c dependent  Skin: Negative for rash.  Neurological: Negative for dizziness, tremors, seizures, weakness and headaches.  Psychiatric/Behavioral: Negative for suicidal ideas and hallucinations. The patient is nervous/anxious.     Immunization History  Administered Date(s) Administered  . Influenza Whole 07/06/2007, 06/18/2008  . Influenza-Unspecified 07/30/2014, 06/16/2015  . PPD Test 02/14/2009  . Pneumococcal Conjugate-13 09/26/2001  . Zoster 04/11/2007   Pertinent  Health Maintenance Due  Topic Date Due  . DEXA SCAN  03/07/1991  . PNA vac Low Risk Adult (2 of 2 - PPSV23) 09/26/2002  . INFLUENZA VACCINE  04/26/2016   Fall Risk  03/23/2015  Falls in the past year? No   Functional Status Survey:    Filed Vitals:   12/07/15 0843  BP: 132/84  Pulse: 76  Temp: 98.1 F (36.7 C)  TempSrc: Oral  Resp: 22  Height: 5\' 1"  (1.549 m)  Weight: 129 lb 12.8 oz (58.877 kg)   Body mass index is 24.54 kg/(m^2). Physical Exam  Constitutional: She is oriented to person, place, and time. She appears well-developed and well-nourished. No distress.  HENT:  Head: Normocephalic and atraumatic.  Eyes: EOM are normal. Pupils are equal, round, and reactive to light.  small pterygium  Neck: Normal range of motion. Neck supple. No JVD present. No thyromegaly present.  Cardiovascular: Normal rate and regular rhythm.   No murmur heard. Pulmonary/Chest: Effort normal and breath sounds normal. She has no wheezes. She has no rales.  Abdominal: Soft. Bowel sounds are normal. There is no tenderness.  Musculoskeletal: Normal range of motion. She exhibits no edema or tenderness.  W/c dependent. Chronic back pain is well managed with Tylenol 650mg  tid.     Lymphadenopathy:    She has no cervical adenopathy.  Neurological: She is alert and oriented to person, place, and time. She displays normal reflexes. No cranial nerve deficit. She exhibits normal muscle tone. Coordination normal.  Skin: Skin is warm and dry. No rash noted. She is not diaphoretic.  Psychiatric: Thought content normal. Her mood appears anxious. Her affect is inappropriate. Her speech is tangential. She is agitated and combative. Cognition and memory are impaired. She expresses impulsivity and inappropriate judgment. She exhibits a depressed mood. She exhibits abnormal recent memory and abnormal remote memory.    Labs reviewed: No results for input(s): NA, K, CL, CO2, GLUCOSE, BUN, CREATININE, CALCIUM, MG, PHOS in the last 8760 hours. No results for input(s): AST, ALT, ALKPHOS, BILITOT, PROT, ALBUMIN  in the last 8760 hours. No results for input(s): WBC, NEUTROABS, HGB, HCT, MCV, PLT in the last 8760 hours. Lab Results  Component Value Date   TSH 1.48 07/26/2012   No results found for: HGBA1C Lab Results  Component Value Date   CHOL 222* 10/19/2007   HDL 102.5 10/19/2007   LDLDIRECT 103.3 10/19/2007   TRIG 60 10/19/2007   CHOLHDL 2.2 CALC 10/19/2007    Significant Diagnostic Results in last 30 days:  No results found.  Assessment/Plan  Major depressive disorder, single episode Mood is stable, except occasional outburst but easily redirected.    Lower back pain Controlled, takesTylenl 650mg  tid  Hx of seizure disorder Continue to be seizure free.   Essential hypertension Controlled. Not taking antihypertensive agents.  Esophageal reflux Stable, off Famotidine 20mg   Dementia in chronic affective disorder (HCC) mood is stabilized, off meds  Constipation Stable, continue MiraLax qod prn and Senokot S II qhs.     Family/ staff Communication: continue SNF for care needs  Labs/tests ordered:  none

## 2015-12-07 NOTE — Assessment & Plan Note (Signed)
Stable, off Famotidine 20mg  

## 2015-12-07 NOTE — Assessment & Plan Note (Signed)
Mood is stable, except occasional outburst but easily redirected.

## 2015-12-07 NOTE — Assessment & Plan Note (Signed)
Controlled, takesTylenl 650mg tid   

## 2015-12-07 NOTE — Assessment & Plan Note (Signed)
mood is stabilized, off meds 

## 2015-12-07 NOTE — Assessment & Plan Note (Signed)
Continue to be seizure free.  

## 2015-12-07 NOTE — Assessment & Plan Note (Signed)
Controlled. Not taking antihypertensive agents.   

## 2015-12-21 ENCOUNTER — Encounter: Payer: Self-pay | Admitting: Nurse Practitioner

## 2015-12-21 NOTE — Progress Notes (Signed)
This encounter was created in error - please disregard.

## 2016-01-04 ENCOUNTER — Non-Acute Institutional Stay (SKILLED_NURSING_FACILITY): Payer: Medicare Other | Admitting: Nurse Practitioner

## 2016-01-04 ENCOUNTER — Encounter: Payer: Self-pay | Admitting: Nurse Practitioner

## 2016-01-04 DIAGNOSIS — F320 Major depressive disorder, single episode, mild: Secondary | ICD-10-CM | POA: Diagnosis not present

## 2016-01-04 DIAGNOSIS — F39 Unspecified mood [affective] disorder: Secondary | ICD-10-CM

## 2016-01-04 DIAGNOSIS — F0280 Dementia in other diseases classified elsewhere without behavioral disturbance: Secondary | ICD-10-CM | POA: Diagnosis not present

## 2016-01-04 DIAGNOSIS — I10 Essential (primary) hypertension: Secondary | ICD-10-CM | POA: Diagnosis not present

## 2016-01-04 DIAGNOSIS — Z8669 Personal history of other diseases of the nervous system and sense organs: Secondary | ICD-10-CM | POA: Diagnosis not present

## 2016-01-04 DIAGNOSIS — K219 Gastro-esophageal reflux disease without esophagitis: Secondary | ICD-10-CM | POA: Diagnosis not present

## 2016-01-04 DIAGNOSIS — M5440 Lumbago with sciatica, unspecified side: Secondary | ICD-10-CM | POA: Diagnosis not present

## 2016-01-04 DIAGNOSIS — K5900 Constipation, unspecified: Secondary | ICD-10-CM | POA: Diagnosis not present

## 2016-01-04 NOTE — Assessment & Plan Note (Signed)
Controlled. Not taking antihypertensive agents.   

## 2016-01-04 NOTE — Assessment & Plan Note (Signed)
mood is stabilized, off meds 

## 2016-01-04 NOTE — Progress Notes (Signed)
Patient ID: Veronica Sanchez, female   DOB: 02-13-1924, 82 y.o.   MRN: [id number]  Location:  [place] Room Number: 102 Place of Service:  SNF (31) Provider: Marlana Latus NP  No primary care provider on file.  Patient Care Team: Clent Jacks, MD as Consulting Physician (Ophthalmology) Gatha Mayer, MD as Consulting Physician (Gastroenterology) Nicholaus Bloom, MD as Consulting Physician (Pain Medicine)  Extended Emergency Contact Information Primary Emergency Contact: [facility] Address: [place]          [place]  [place] Home Phone: [phone] Relation: None  Code Status:  DNR Goals of care: Advanced Directive information Advanced Directives 01/04/2016  Does patient have an advance directive? Yes  Type of Paramedic of [place];Living will;Out of facility DNR (pink MOST or yellow form)  Does patient want to make changes to advanced directive? No - Patient declined  Copy of advanced directive(s) in chart? Yes     Chief Complaint  Patient presents with  . Medical Management of Chronic Issues    Routine visit    HPI:  Pt is a 82 y.o. female seen today for medical management of chronic diseases.  Hx of seizures, successfully weaned off life long antiepileptic agents. Hx of HTN, normal Bp, off meds. Her chronic back pain is better managed since she is no longer ambulating, scheduled Tylenol is adequate for pain management. She has end stage of dementia, total dependent of her ADLs except feeding self sometimes. Her mood is stable except sometimes she is agitated and combative due to her lack of understanding of surroundings.     Past Medical History  Diagnosis Date  . Depression   . Hyperlipidemia   . Alzheimer's disease   . Hypertension   . GERD (gastroesophageal reflux disease)   . Backache   . Spinal stenosis   . Colon polyp   . Diverticulosis   . Osteoarthritis   . Osteoporosis     07/23/09 x-ray  of the lumbar and thoracic spine showed an L1/L2 mild anterior wedge deformity  . Seizure disorder ([place])   . Unstable gait   . Constipation 03/06/2013    03/19/14: Senokot S II qhs. Continue MiraLax qod    Past Surgical History  Procedure Laterality Date  . Tonsillectomy and adenoidectomy Bilateral 1933  . Cataract extraction      Dr. Katy Fitch  . Colonoscopy  02/01/2002    AVM of the ascending colon, 3 mm polyp, AVM and transverse colon, diverticulosis  . Esophagogastroduodenoscopy  07/05/2005    Esophageal stricture, 4 cm hiatal hernia  . Epidural block injection  2007    Allergies  Allergen Reactions  . Amoxicillin     REACTION: unspecified  . Celebrex [Celecoxib]   . Codeine Sulfate     REACTION: unspecified  . Naproxen Sodium     REACTION: unspecified  . Reminyl [Galantamine Hydrobromide]   . Versed [Midazolam]   . Vioxx [Rofecoxib]       Medication List       This list is accurate as of: 01/04/16  2:42 PM.  Always use your most recent med list.               acetaminophen 325 MG tablet  Commonly known as:  TYLENOL  Take 650 mg by mouth 3 (three) times daily. Take 2 tabs by mouth daily as needed for pain ( not to exceed 3 gm)     feeding supplement Liqd  Take 1 Container by mouth. 90 ML by mouth twice daily     polyethylene glycol packet  Commonly known as:  MIRALAX / GLYCOLAX  Take 17 g by mouth daily. Fill cap to 17 gm mark, mix with 4 ounces of fluid and take by mouth daily.     sennosides-docusate sodium 8.6-50 MG tablet  Commonly known as:  SENOKOT-S  Take 2 tablets by mouth daily.        Review of Systems  Constitutional: Negative for fever and chills.  HENT: Positive for hearing loss. Negative for congestion, ear discharge, ear pain, nosebleeds and sore throat.   Eyes: Negative for pain, discharge and redness.       Small pterygium  Respiratory: Negative for cough, shortness of breath and wheezing.   Cardiovascular: Negative for chest pain,  palpitations and leg swelling.  Gastrointestinal: Negative for nausea, vomiting, abdominal pain, diarrhea and constipation.  Genitourinary: Positive for frequency. Negative for dysuria, urgency and flank pain.       Urinary incontinence  Musculoskeletal: Positive for back pain. Negative for myalgias and neck pain.       W/c dependent  Skin: Negative for rash.  Neurological: Negative for dizziness, tremors, seizures, weakness and headaches.  Psychiatric/Behavioral: Negative for suicidal ideas and hallucinations. The patient is nervous/anxious.     Immunization History  Administered Date(s) Administered  . Influenza Whole 07/06/2007, 06/18/2008  . Influenza-Unspecified 07/30/2014, 06/16/2015  . PPD Test 02/14/2009  . Pneumococcal Conjugate-13 09/26/2001  . Zoster 04/11/2007   Pertinent  Health Maintenance Due  Topic Date Due  . DEXA SCAN  03/07/1991  . PNA vac Low Risk Adult (2 of 2 - PPSV23) 09/26/2002  . INFLUENZA VACCINE  04/26/2016   Fall Risk  03/23/2015  Falls in the past year? No   Functional Status Survey:    Filed Vitals:   01/04/16 1121  BP: 120/68  Pulse: 60  Temp: 97.3 F (36.3 C)  TempSrc: Oral  Resp: 18  Height: 5\' 1"  (1.549 m)  Weight: 124 lb 9.6 oz (56.518 kg)  SpO2: 99%   Body mass index is 23.56 kg/(m^2). Physical Exam  Constitutional: She is oriented to person, place, and time. She appears well-developed and well-nourished. No distress.  HENT:  Head: Normocephalic and atraumatic.  Eyes: EOM are normal. Pupils are equal, round, and reactive to light.  small pterygium  Neck: Normal range of motion. Neck supple. No JVD present. No thyromegaly present.  Cardiovascular: Normal rate and regular rhythm.   No murmur heard. Pulmonary/Chest: Effort normal and breath sounds normal. She has no wheezes. She has no rales.  Abdominal: Soft. Bowel sounds are normal. There is no tenderness.  Musculoskeletal: Normal range of motion. She exhibits no edema or  tenderness.  W/c dependent. Chronic back pain is well managed with Tylenol 650mg  tid.   Lymphadenopathy:    She has no cervical adenopathy.  Neurological: She is alert and oriented to person, place, and time. She displays normal reflexes. No cranial nerve deficit. She exhibits normal muscle tone. Coordination normal.  Skin: Skin is warm and dry. No rash noted. She is not diaphoretic.  Psychiatric: Thought content normal. Her mood appears anxious. Her affect is inappropriate. Her speech is tangential. She is agitated and combative. Cognition and memory are impaired. She expresses impulsivity and inappropriate judgment. She exhibits a depressed mood. She exhibits abnormal recent memory and abnormal remote memory.    Labs reviewed: No results for input(s): NA, K, CL, CO2, GLUCOSE, BUN, CREATININE, CALCIUM,  MG, PHOS in the last 8760 hours. No results for input(s): AST, ALT, ALKPHOS, BILITOT, PROT, ALBUMIN in the last 8760 hours. No results for input(s): WBC, NEUTROABS, HGB, HCT, MCV, PLT in the last 8760 hours. Lab Results  Component Value Date   TSH 1.48 07/26/2012   No results found for: HGBA1C Lab Results  Component Value Date   CHOL 222* 10/19/2007   HDL 102.5 10/19/2007   LDLDIRECT 103.3 10/19/2007   TRIG 60 10/19/2007   CHOLHDL 2.2 CALC 10/19/2007    Significant Diagnostic Results in last 30 days:  No results found.  Assessment/Plan  Constipation Stable, continue MiraLax qod prn and Senokot S II qhs.   Dementia in chronic affective disorder (HCC) mood is stabilized, off meds  Esophageal reflux Stable, off Famotidine 20mg   Essential hypertension Controlled. Not taking antihypertensive agents.  Hx of seizure disorder Continue to be seizure free.   Lower back pain Controlled, takesTylenl 650mg  tid  Major depressive disorder, single episode Mood is stable, except occasional outburst but easily redirected.      Family/ staff Communication: continue SNF for care  needs  Labs/tests ordered:  none

## 2016-01-04 NOTE — Assessment & Plan Note (Signed)
Stable, continue MiraLax qod prn and Senokot S II qhs.  

## 2016-01-04 NOTE — Assessment & Plan Note (Signed)
Stable, off Famotidine 20mg  

## 2016-01-04 NOTE — Assessment & Plan Note (Signed)
Continue to be seizure free.  

## 2016-01-04 NOTE — Assessment & Plan Note (Signed)
Mood is stable, except occasional outburst but easily redirected.

## 2016-01-04 NOTE — Assessment & Plan Note (Signed)
Controlled, takesTylenl 650mg tid   

## 2016-02-05 ENCOUNTER — Encounter: Payer: Self-pay | Admitting: Nurse Practitioner

## 2016-02-05 ENCOUNTER — Non-Acute Institutional Stay (SKILLED_NURSING_FACILITY): Payer: Medicare Other | Admitting: Nurse Practitioner

## 2016-02-05 DIAGNOSIS — Z8669 Personal history of other diseases of the nervous system and sense organs: Secondary | ICD-10-CM | POA: Diagnosis not present

## 2016-02-05 DIAGNOSIS — F0280 Dementia in other diseases classified elsewhere without behavioral disturbance: Secondary | ICD-10-CM

## 2016-02-05 DIAGNOSIS — K5900 Constipation, unspecified: Secondary | ICD-10-CM | POA: Diagnosis not present

## 2016-02-05 DIAGNOSIS — M5440 Lumbago with sciatica, unspecified side: Secondary | ICD-10-CM | POA: Diagnosis not present

## 2016-02-05 DIAGNOSIS — I10 Essential (primary) hypertension: Secondary | ICD-10-CM | POA: Diagnosis not present

## 2016-02-05 DIAGNOSIS — F39 Unspecified mood [affective] disorder: Secondary | ICD-10-CM

## 2016-02-05 DIAGNOSIS — F320 Major depressive disorder, single episode, mild: Secondary | ICD-10-CM

## 2016-02-05 DIAGNOSIS — K219 Gastro-esophageal reflux disease without esophagitis: Secondary | ICD-10-CM

## 2016-02-05 NOTE — Assessment & Plan Note (Signed)
Controlled. Not taking antihypertensive agents.   

## 2016-02-05 NOTE — Assessment & Plan Note (Signed)
mood is stabilized, off meds

## 2016-02-05 NOTE — Assessment & Plan Note (Signed)
Continue to be seizure free.

## 2016-02-05 NOTE — Progress Notes (Signed)
Patient ID: Veronica Sanchez, female   DOB: Aug 01, 1924, 82 y.o.   MRN: [id number]  Location:  [place] Room Number: 102 Place of Service:  SNF (31) Provider: Marlana Latus NP  No primary care provider on file.  Patient Care Team: Clent Jacks, MD as Consulting Physician (Ophthalmology) Gatha Mayer, MD as Consulting Physician (Gastroenterology) Nicholaus Bloom, MD as Consulting Physician (Pain Medicine)  Extended Emergency Contact Information Primary Emergency Contact: [facility] Address: [place]          [place]  [place] Home Phone: [phone] Relation: None  Code Status:  DNR Goals of care: Advanced Directive information Advanced Directives 02/05/2016  Does patient have an advance directive? Yes  Type of Paramedic of [place];Living will;Out of facility DNR (pink MOST or yellow form)  Does patient want to make changes to advanced directive? No - Patient declined  Copy of advanced directive(s) in chart? Yes     Chief Complaint  Patient presents with  . Medical Management of Chronic Issues    Routine Visit    HPI:  Pt is a 82 y.o. female seen today for medical management of chronic diseases.  Hx of seizures, successfully weaned off life long antiepileptic agents. Hx of HTN, normal Bp, off meds. Her chronic back pain is better managed since she is no longer ambulating, scheduled Tylenol is adequate for pain management. She has end stage of dementia, total dependent of her ADLs except feeding self sometimes. Her mood is stable except sometimes she is agitated and combative due to her lack of understanding of surroundings.     Past Medical History  Diagnosis Date  . Depression   . Hyperlipidemia   . Alzheimer's disease   . Hypertension   . GERD (gastroesophageal reflux disease)   . Backache   . Spinal stenosis   . Colon polyp   . Diverticulosis   . Osteoarthritis   . Osteoporosis     07/23/09 x-ray  of the lumbar and thoracic spine showed an L1/L2 mild anterior wedge deformity  . Seizure disorder ([place])   . Unstable gait   . Constipation 03/06/2013    03/19/14: Senokot S II qhs. Continue MiraLax qod    Past Surgical History  Procedure Laterality Date  . Tonsillectomy and adenoidectomy Bilateral 1933  . Cataract extraction      Dr. Katy Fitch  . Colonoscopy  02/01/2002    AVM of the ascending colon, 3 mm polyp, AVM and transverse colon, diverticulosis  . Esophagogastroduodenoscopy  07/05/2005    Esophageal stricture, 4 cm hiatal hernia  . Epidural block injection  2007    Allergies  Allergen Reactions  . Amoxicillin     REACTION: unspecified  . Celebrex [Celecoxib]   . Codeine Sulfate     REACTION: unspecified  . Naproxen Sodium     REACTION: unspecified  . Reminyl [Galantamine Hydrobromide]   . Versed [Midazolam]   . Vioxx [Rofecoxib]       Medication List       This list is accurate as of: 02/05/16 11:17 AM.  Always use your most recent med list.               acetaminophen 325 MG tablet  Commonly known as:  TYLENOL  Take 650 mg by mouth 3 (three) times daily. Take 2 tabs by mouth daily as needed for pain ( not to exceed 3 gm)     feeding supplement Liqd  Take  1 Container by mouth. 90 ML by mouth twice daily     polyethylene glycol packet  Commonly known as:  MIRALAX / GLYCOLAX  Take 17 g by mouth daily. Fill cap to 17 gm mark, mix with 4 ounces of fluid and take by mouth daily.     sennosides-docusate sodium 8.6-50 MG tablet  Commonly known as:  SENOKOT-S  Take 2 tablets by mouth daily.        Review of Systems  Constitutional: Negative for fever and chills.  HENT: Positive for hearing loss. Negative for congestion, ear discharge, ear pain, nosebleeds and sore throat.   Eyes: Negative for pain, discharge and redness.       Small pterygium  Respiratory: Negative for cough, shortness of breath and wheezing.   Cardiovascular: Negative for chest pain,  palpitations and leg swelling.  Gastrointestinal: Negative for nausea, vomiting, abdominal pain, diarrhea and constipation.  Genitourinary: Positive for frequency. Negative for dysuria, urgency and flank pain.       Urinary incontinence  Musculoskeletal: Positive for back pain. Negative for myalgias and neck pain.       W/c dependent  Skin: Negative for rash.  Neurological: Negative for dizziness, tremors, seizures, weakness and headaches.  Psychiatric/Behavioral: Negative for suicidal ideas and hallucinations. The patient is nervous/anxious.     Immunization History  Administered Date(s) Administered  . Influenza Whole 07/06/2007, 06/18/2008  . Influenza-Unspecified 07/30/2014, 06/16/2015  . PPD Test 02/14/2009  . Pneumococcal Conjugate-13 09/26/2001  . Zoster 04/11/2007   Pertinent  Health Maintenance Due  Topic Date Due  . DEXA SCAN  03/07/1991  . PNA vac Low Risk Adult (2 of 2 - PPSV23) 09/26/2002  . INFLUENZA VACCINE  04/26/2016   Fall Risk  03/23/2015  Falls in the past year? No   Functional Status Survey:    Filed Vitals:   02/05/16 0845  BP: 120/68  Pulse: 66  Temp: 97 F (36.1 C)  TempSrc: Oral  Resp: 20  Height: 5\' 1"  (1.549 m)  Weight: 128 lb (58.06 kg)   Body mass index is 24.2 kg/(m^2). Physical Exam  Constitutional: She is oriented to person, place, and time. She appears well-developed and well-nourished. No distress.  HENT:  Head: Normocephalic and atraumatic.  Eyes: EOM are normal. Pupils are equal, round, and reactive to light.  small pterygium  Neck: Normal range of motion. Neck supple. No JVD present. No thyromegaly present.  Cardiovascular: Normal rate and regular rhythm.   No murmur heard. Pulmonary/Chest: Effort normal and breath sounds normal. She has no wheezes. She has no rales.  Abdominal: Soft. Bowel sounds are normal. There is no tenderness.  Musculoskeletal: Normal range of motion. She exhibits no edema or tenderness.  W/c dependent.  Chronic back pain is well managed with Tylenol 650mg  tid.   Lymphadenopathy:    She has no cervical adenopathy.  Neurological: She is alert and oriented to person, place, and time. She displays normal reflexes. No cranial nerve deficit. She exhibits normal muscle tone. Coordination normal.  Skin: Skin is warm and dry. No rash noted. She is not diaphoretic.  Psychiatric: Thought content normal. Her mood appears anxious. Her affect is inappropriate. Her speech is tangential. She is agitated and combative. Cognition and memory are impaired. She expresses impulsivity and inappropriate judgment. She exhibits a depressed mood. She exhibits abnormal recent memory and abnormal remote memory.    Labs reviewed: No results for input(s): NA, K, CL, CO2, GLUCOSE, BUN, CREATININE, CALCIUM, MG, PHOS in the last 8760  hours. No results for input(s): AST, ALT, ALKPHOS, BILITOT, PROT, ALBUMIN in the last 8760 hours. No results for input(s): WBC, NEUTROABS, HGB, HCT, MCV, PLT in the last 8760 hours. Lab Results  Component Value Date   TSH 1.48 07/26/2012   No results found for: HGBA1C Lab Results  Component Value Date   CHOL 222* 10/19/2007   HDL 102.5 10/19/2007   LDLDIRECT 103.3 10/19/2007   TRIG 60 10/19/2007   CHOLHDL 2.2 CALC 10/19/2007    Significant Diagnostic Results in last 30 days:  No results found.  Assessment/Plan  Constipation Stable, continue MiraLax qod prn and Senokot S II qhs.   Dementia in chronic affective disorder (HCC) mood is stabilized, off meds   Esophageal reflux Stable, off Famotidine 20mg    Essential hypertension Controlled. Not taking antihypertensive agents.   Hx of seizure disorder Continue to be seizure free.    Lower back pain Controlled, takesTylenl 650mg  tid   Major depressive disorder, single episode Mood is stable, except occasional outburst but easily redirected.       Family/ staff Communication: continue SNF for care needs  Labs/tests  ordered:  none

## 2016-02-05 NOTE — Assessment & Plan Note (Signed)
Controlled, takesTylenl 650mg tid   

## 2016-02-05 NOTE — Assessment & Plan Note (Signed)
Stable, continue MiraLax qod prn and Senokot S II qhs.

## 2016-02-05 NOTE — Assessment & Plan Note (Signed)
Stable, off Famotidine 20mg 

## 2016-02-05 NOTE — Assessment & Plan Note (Signed)
Mood is stable, except occasional outburst but easily redirected.

## 2016-03-07 ENCOUNTER — Non-Acute Institutional Stay (SKILLED_NURSING_FACILITY): Payer: Medicare Other | Admitting: Nurse Practitioner

## 2016-03-07 DIAGNOSIS — M5440 Lumbago with sciatica, unspecified side: Secondary | ICD-10-CM | POA: Diagnosis not present

## 2016-03-07 DIAGNOSIS — I10 Essential (primary) hypertension: Secondary | ICD-10-CM

## 2016-03-07 DIAGNOSIS — K219 Gastro-esophageal reflux disease without esophagitis: Secondary | ICD-10-CM

## 2016-03-07 DIAGNOSIS — F39 Unspecified mood [affective] disorder: Secondary | ICD-10-CM | POA: Diagnosis not present

## 2016-03-07 DIAGNOSIS — K5900 Constipation, unspecified: Secondary | ICD-10-CM

## 2016-03-07 DIAGNOSIS — Z8669 Personal history of other diseases of the nervous system and sense organs: Secondary | ICD-10-CM

## 2016-03-07 DIAGNOSIS — F0280 Dementia in other diseases classified elsewhere without behavioral disturbance: Secondary | ICD-10-CM | POA: Diagnosis not present

## 2016-03-07 DIAGNOSIS — F324 Major depressive disorder, single episode, in partial remission: Secondary | ICD-10-CM | POA: Diagnosis not present

## 2016-03-07 NOTE — Assessment & Plan Note (Signed)
mood is stabilized, off meds

## 2016-03-07 NOTE — Assessment & Plan Note (Signed)
Stable, continue MiraLax qod prn and Senokot S II qhs.

## 2016-03-07 NOTE — Assessment & Plan Note (Signed)
Controlled, takesTylenl 650mg tid   

## 2016-03-07 NOTE — Assessment & Plan Note (Signed)
Continue to be seizure free.

## 2016-03-07 NOTE — Assessment & Plan Note (Signed)
Controlled. Not taking antihypertensive agents.   

## 2016-03-07 NOTE — Assessment & Plan Note (Signed)
Stable, off Famotidine 20mg 

## 2016-03-07 NOTE — Progress Notes (Signed)
Patient ID: Veronica Sanchez, female   DOB: 08-17-1924, 82 y.o.   MRN: [id number]  Location:  [place] Room Number: 102 Place of Service:  SNF (31) Provider: Marlana Latus NP  No primary care provider on file.  Patient Care Team: Clent Jacks, MD as Consulting Physician (Ophthalmology) Gatha Mayer, MD as Consulting Physician (Gastroenterology) Nicholaus Bloom, MD as Consulting Physician (Pain Medicine)  Extended Emergency Contact Information Primary Emergency Contact: [facility] Address: [address]  [place] Home Phone: [phone] Relation: None  Code Status:  DNR Goals of care: Advanced Directive information Advanced Directives 03/07/2016  Does patient have an advance directive? Yes  Type of Paramedic of [place];Living will;Out of facility DNR (pink MOST or yellow form)  Does patient want to make changes to advanced directive? No - Patient declined  Copy of advanced directive(s) in chart? Yes     Chief Complaint  Patient presents with  . Medical Management of Chronic Issues    HPI:  Pt is a 82 y.o. female seen today for medical management of chronic diseases.  Hx of seizures, successfully weaned off life long antiepileptic agents. Hx of HTN, normal Bp, off meds. Her chronic back pain is better managed since she is no longer ambulating, scheduled Tylenol is adequate for pain management. She has end stage of dementia, total dependent of her ADLs except feeding self sometimes. Her mood is stable except sometimes she is agitated and combative due to her lack of understanding of surroundings.     Past Medical History  Diagnosis Date  . Depression   . Hyperlipidemia   . Alzheimer's disease   . Hypertension   . GERD (gastroesophageal reflux disease)   . Backache   . Spinal stenosis   . Colon polyp   . Diverticulosis   . Osteoarthritis   . Osteoporosis     07/23/09 x-ray of the lumbar and  thoracic spine showed an L1/L2 mild anterior wedge deformity  . Seizure disorder ([place])   . Unstable gait   . Constipation 03/06/2013    03/19/14: Senokot S II qhs. Continue MiraLax qod    Past Surgical History  Procedure Laterality Date  . Tonsillectomy and adenoidectomy Bilateral 1933  . Cataract extraction      Dr. Katy Fitch  . Colonoscopy  02/01/2002    AVM of the ascending colon, 3 mm polyp, AVM and transverse colon, diverticulosis  . Esophagogastroduodenoscopy  07/05/2005    Esophageal stricture, 4 cm hiatal hernia  . Epidural block injection  2007    Allergies  Allergen Reactions  . Amoxicillin     REACTION: unspecified  . Celebrex [Celecoxib]   . Codeine Sulfate     REACTION: unspecified  . Naproxen Sodium     REACTION: unspecified  . Reminyl [Galantamine Hydrobromide]   . Versed [Midazolam]   . Vioxx [Rofecoxib]       Medication List       This list is accurate as of: 03/07/16 11:59 PM.  Always use your most recent med list.               acetaminophen 325 MG tablet  Commonly known as:  TYLENOL  Take 650 mg by mouth 3 (three) times daily. Take 2 tabs by mouth daily as needed for pain ( not to exceed 3 gm)     feeding supplement Liqd  Take 1 Container by mouth. [place]  ML by mouth twice daily     polyethylene glycol packet  Commonly known as:  MIRALAX / GLYCOLAX  Take 17 g by mouth daily. Fill cap to 17 gm mark, mix with 4 ounces of fluid and take by mouth daily.     sennosides-docusate sodium 8.6-50 MG tablet  Commonly known as:  SENOKOT-S  Take 2 tablets by mouth daily.        Review of Systems  Constitutional: Negative for fever and chills.  HENT: Positive for hearing loss. Negative for congestion, ear discharge, ear pain, nosebleeds and sore throat.   Eyes: Negative for pain, discharge and redness.       Small pterygium  Respiratory: Negative for cough, shortness of breath and wheezing.   Cardiovascular: Negative for chest pain, palpitations and leg  swelling.  Gastrointestinal: Negative for nausea, vomiting, abdominal pain, diarrhea and constipation.  Genitourinary: Positive for frequency. Negative for dysuria, urgency and flank pain.       Urinary incontinence  Musculoskeletal: Positive for back pain. Negative for myalgias and neck pain.       W/c dependent  Skin: Negative for rash.  Neurological: Negative for dizziness, tremors, seizures, weakness and headaches.  Psychiatric/Behavioral: Negative for suicidal ideas and hallucinations. The patient is nervous/anxious.     Immunization History  Administered Date(s) Administered  . Influenza Whole 07/06/2007, 06/18/2008  . Influenza-Unspecified 07/30/2014, 06/16/2015  . PPD Test 02/14/2009  . Pneumococcal Conjugate-13 09/26/2001  . Zoster 04/11/2007   Pertinent  Health Maintenance Due  Topic Date Due  . DEXA SCAN  03/07/1991  . PNA vac Low Risk Adult (2 of 2 - PPSV23) 09/26/2002  . INFLUENZA VACCINE  04/26/2016   Fall Risk  03/23/2015  Falls in the past year? No   Functional Status Survey:    Filed Vitals:   03/07/16 1255  BP: 147/85  Pulse: 80  Temp: 98.2 F (36.8 C)  TempSrc: Oral  Resp: 20   There is no weight on file to calculate BMI. Physical Exam  Constitutional: She is oriented to person, place, and time. She appears well-developed and well-nourished. No distress.  HENT:  Head: Normocephalic and atraumatic.  Eyes: EOM are normal. Pupils are equal, round, and reactive to light.  small pterygium  Neck: Normal range of motion. Neck supple. No JVD present. No thyromegaly present.  Cardiovascular: Normal rate and regular rhythm.   No murmur heard. Pulmonary/Chest: Effort normal and breath sounds normal. She has no wheezes. She has no rales.  Abdominal: Soft. Bowel sounds are normal. There is no tenderness.  Musculoskeletal: Normal range of motion. She exhibits no edema or tenderness.  W/c dependent. Chronic back pain is well managed with Tylenol 650mg  tid.     Lymphadenopathy:    She has no cervical adenopathy.  Neurological: She is alert and oriented to person, place, and time. She displays normal reflexes. No cranial nerve deficit. She exhibits normal muscle tone. Coordination normal.  Skin: Skin is warm and dry. No rash noted. She is not diaphoretic.  Psychiatric: Thought content normal. Her mood appears anxious. Her affect is inappropriate. Her speech is tangential. She is agitated and combative. Cognition and memory are impaired. She expresses impulsivity and inappropriate judgment. She exhibits a depressed mood. She exhibits abnormal recent memory and abnormal remote memory.    Labs reviewed: No results for input(s): NA, K, CL, CO2, GLUCOSE, BUN, CREATININE, CALCIUM, MG, PHOS in the last 8760 hours. No results for input(s): AST, ALT, ALKPHOS, BILITOT, PROT, ALBUMIN in the  last 8760 hours. No results for input(s): WBC, NEUTROABS, HGB, HCT, MCV, PLT in the last 8760 hours. Lab Results  Component Value Date   TSH 1.48 07/26/2012   No results found for: HGBA1C Lab Results  Component Value Date   CHOL 222* 10/19/2007   HDL 102.5 10/19/2007   LDLDIRECT 103.3 10/19/2007   TRIG 60 10/19/2007   CHOLHDL 2.2 CALC 10/19/2007    Significant Diagnostic Results in last 30 days:  No results found.  Assessment/Plan  Essential hypertension Controlled. Not taking antihypertensive agents.  Esophageal reflux Stable, off Famotidine 20mg   Constipation Stable, continue MiraLax qod prn and Senokot S II qhs.   Dementia in chronic affective disorder (HCC) mood is stabilized, off meds  Major depressive disorder, single episode Mood is stable, except occasional outburst but easily redirected.   Hx of seizure disorder Continue to be seizure free.   Lower back pain Controlled, takesTylenl 650mg  tid      Family/ staff Communication: continue SNF for care needs  Labs/tests ordered:  none

## 2016-03-07 NOTE — Assessment & Plan Note (Signed)
Mood is stable, except occasional outburst but easily redirected.

## 2016-03-25 ENCOUNTER — Encounter: Payer: Self-pay | Admitting: Adult Health

## 2016-03-25 NOTE — Progress Notes (Signed)
Patient ID: Veronica Sanchez, female   DOB: 11/21/1923, 82 y.o.   MRN: [id number]    Location:   [place] Room Number: 102-A Place of Service:  SNF (31)   CODE STATUS: DNR  Allergies  Allergen Reactions  . Amoxicillin     REACTION: unspecified  . Celebrex [Celecoxib]   . Codeine Sulfate     REACTION: unspecified  . Naproxen Sodium     REACTION: unspecified  . Reminyl [Galantamine Hydrobromide]   . Versed [Midazolam]   . Vioxx [Rofecoxib]     Chief Complaint  Patient presents with  . Acute Visit    L eye lid swollen    HPI:    Past Medical History  Diagnosis Date  . Depression   . Hyperlipidemia   . Alzheimer's disease   . Hypertension   . GERD (gastroesophageal reflux disease)   . Backache   . Spinal stenosis   . Colon polyp   . Diverticulosis   . Osteoarthritis   . Osteoporosis     07/23/09 x-ray of the lumbar and thoracic spine showed an L1/L2 mild anterior wedge deformity  . Seizure disorder ([place])   . Unstable gait   . Constipation 03/06/2013    03/19/14: Senokot S II qhs. Continue MiraLax qod     Past Surgical History  Procedure Laterality Date  . Tonsillectomy and adenoidectomy Bilateral 1933  . Cataract extraction      Dr. Katy Fitch  . Colonoscopy  02/01/2002    AVM of the ascending colon, 3 mm polyp, AVM and transverse colon, diverticulosis  . Esophagogastroduodenoscopy  07/05/2005    Esophageal stricture, 4 cm hiatal hernia  . Epidural block injection  2007    Social History   Social History  . Marital Status: Married    Spouse Name: N/A  . Number of Children: N/A  . Years of Education: N/A   Occupational History  . Not on file.   Social History Main Topics  . Smoking status: Former Smoker -- 30 years    Types: Cigarettes  . Smokeless tobacco: Not on file  . Alcohol Use: No  . Drug Use: No  . Sexual Activity: Not on file   Other Topics Concern  . Not on file   Social History Narrative   Married 1950.   [facility].   Previously employed as a Network engineer   History of previous smoking one half pack per day for 20-30 years.   No significant use of alcohol.   Family History  Problem Relation Age of Onset  . Stroke Mother   . Dementia Mother   . Alcohol abuse Father   . Arthritis Sister   . Arthritis Sister       VITAL SIGNS BP 108/68 mmHg  Pulse 74  Temp(Src) 98.4 F (36.9 C) (Oral)  Resp 22  Ht 5\' 1"  (1.549 m)  Wt 130 lb (58.968 kg)  BMI 24.58 kg/m2  LMP  (LMP Unknown)  Patient's Medications  New Prescriptions   No medications on file  Previous Medications   ACETAMINOPHEN (TYLENOL) 325 MG TABLET    Take 650 mg by mouth 3 (three) times daily. Take 2 tabs by mouth daily as needed for pain ( not to exceed 3 gm)   FEEDING SUPPLEMENT (BOOST / RESOURCE BREEZE) LIQD    Take 1 Container by mouth. 90 ML by mouth twice daily   NUTRITIONAL SUPPLEMENTS (NUTRITIONAL SUPPLEMENT PO)    Take by mouth.  Puree with honey thick liquids   POLYETHYLENE GLYCOL (MIRALAX / GLYCOLAX) PACKET    Take 17 g by mouth daily. Fill cap to 17 gm mark, mix with 4 ounces of fluid and take by mouth daily.   SENNOSIDES-DOCUSATE SODIUM (SENOKOT-S) 8.6-50 MG TABLET    Take 2 tablets by mouth daily.  Modified Medications   No medications on file  Discontinued Medications   No medications on file     SIGNIFICANT DIAGNOSTIC EXAMS       ASSESSMENT/ PLAN:    Ok Edwards NP [facility]  Contact [phone] Monday through Friday 8am- 5pm  After hours call [phone]    This encounter was created in error - please disregard.

## 2016-04-28 ENCOUNTER — Encounter: Payer: Self-pay | Admitting: Internal Medicine

## 2016-04-28 ENCOUNTER — Non-Acute Institutional Stay (SKILLED_NURSING_FACILITY): Payer: Medicare Other | Admitting: Internal Medicine

## 2016-04-28 DIAGNOSIS — Z8669 Personal history of other diseases of the nervous system and sense organs: Secondary | ICD-10-CM | POA: Diagnosis not present

## 2016-04-28 DIAGNOSIS — C44310 Basal cell carcinoma of skin of unspecified parts of face: Secondary | ICD-10-CM

## 2016-04-28 DIAGNOSIS — F02818 Dementia in other diseases classified elsewhere, unspecified severity, with other behavioral disturbance: Secondary | ICD-10-CM

## 2016-04-28 DIAGNOSIS — G301 Alzheimer's disease with late onset: Secondary | ICD-10-CM

## 2016-04-28 DIAGNOSIS — I10 Essential (primary) hypertension: Secondary | ICD-10-CM

## 2016-04-28 DIAGNOSIS — F0281 Dementia in other diseases classified elsewhere with behavioral disturbance: Secondary | ICD-10-CM | POA: Diagnosis not present

## 2016-04-28 NOTE — Progress Notes (Signed)
History and Physical   Provider:  Dr. Jeanmarie Hubert Location:   [place] Room Number: N33 Place of Service:  SNF ([phone])  PCP: Jeanmarie Hubert, MD Patient Care Team: Clent Jacks, MD as Consulting Physician (Ophthalmology) Gatha Mayer, MD as Consulting Physician (Gastroenterology) Nicholaus Bloom, MD as Consulting Physician (Pain Medicine)  Extended Emergency Contact Information Primary Emergency Contact: [facility] Address: [address]  [place] Home Phone: [phone] Relation: None  Code Status: DNR Goals of Care: Advanced Directive information Advanced Directives 04/28/2016  Does patient have an advance directive? Yes  Type of Paramedic of [place];Living will;Out of facility DNR (pink MOST or yellow form)  Does patient want to make changes to advanced directive? -  Copy of advanced directive(s) in chart? Yes  Pre-existing out of facility DNR order (yellow form or pink MOST form) -      Chief Complaint  Patient presents with  . New Admit To SNF    transfer from [facility] to [facility] per family request    HPI: Patient is a 82 y.o. female seen today folloowing admission to [facility] skilled nursing facility by transfer from the memory care unit at [facility]. Patient's dementia has progressed to the point that she no longer is benefiting from programs offered in the memory care unit. Husband lives at [facility]. He requested transfer to skilled care at [facility].  Patient has some declining memory related to Alzheimer's disease.  There is basal cell cancer of the right cheek which is slowly enlarging.  She has a seizure history, but has had no seizures in years.  Other problems include hypertension, HLD,  and recurrent low back pains.  Patient is admitted for long-term care. We do not expect improvement in her dementia. Lab work has not been followed in the last 4  years. With her declining condition, there seems to be no real advantage and following this at the present time. She seems stable other than her declining memory. There are behavioral challenges associated with this. She grimaces and yells. Personal care is difficult at times.  This is anticipated to be a long-term care admission.  Past Medical History:  Diagnosis Date  . Alzheimer's disease   . Backache   . Basal cell carcinoma of face 03/23/2015   Left lower cheek and right upper cheek   . Colon polyp   . Constipation 03/06/2013   03/19/14: Senokot S II qhs. Continue MiraLax qod   . Dementia in chronic affective disorder ([place]) 06/20/2007   Qualifier: Diagnosis of  By: Leanne Chang MD, Bruce  MMSE 9/30 02/07/14 Off Namenda and Aricept 02/26/14 04/02/14 Depakote 250mg  bid 04/07/14 hold Buspirone, Depakote, Tramadol(dc'd Buspirone and changed Tramadol prn subsequently) 12/08/14 reduce Depakote from 250mg  bid to 125mg  am and 250mg  pm.  01/12/15 dc am Depakote and continue 250mg  pm.     . Depression   . Diverticulosis   . Esophageal reflux 12/26/2012  . GERD (gastroesophageal reflux disease)   . Hx of seizure disorder 12/04/2013  . Hyperlipidemia   . Hypertension   . Major depressive disorder, single episode 12/26/2012  . Osteoarthritis   . Osteoporosis    07/23/09 x-ray of the lumbar and thoracic spine showed an L1/L2 mild anterior wedge deformity  . Pterygium of right eye 10/14/2015  . Seizure disorder ([place])   . Spinal stenosis   .  Unstable gait    Past Surgical History:  Procedure Laterality Date  . CATARACT EXTRACTION     Dr. Katy Fitch  . COLONOSCOPY  02/01/2002   AVM of the ascending colon, 3 mm polyp, AVM and transverse colon, diverticulosis  . EPIDURAL BLOCK INJECTION  2007  . ESOPHAGOGASTRODUODENOSCOPY  07/05/2005   Esophageal stricture, 4 cm hiatal hernia  . TONSILLECTOMY AND ADENOIDECTOMY Bilateral 1933    reports that she has quit smoking. Her smoking use included Cigarettes. She quit after 30.00  years of use. She has never used smokeless tobacco. She reports that she does not drink alcohol or use drugs. Social History   Social History  . Marital status: Married    Spouse name: N/A  . Number of children: N/A  . Years of education: N/A   Occupational History  . Not on file.   Social History Main Topics  . Smoking status: Former Smoker    Years: 30.00    Types: Cigarettes  . Smokeless tobacco: Never Used  . Alcohol use No  . Drug use: No  . Sexual activity: Not on file   Other Topics Concern  . Not on file   Social History Narrative   Married 1950.    skilled nursing facility since 2010. [facility]red from [facility] to [facility]  04/26/16   Previously employed as a Network engineer   History of previous smoking one half pack per day for 20-30 years.   No significant use of alcohol.   DNR, POA, Living Will     Family History  Problem Relation Age of Onset  . Stroke Mother   . Dementia Mother   . Alcohol abuse Father   . Arthritis Sister   . Arthritis Sister     Health Maintenance  Topic Date Due  . DEXA SCAN  03/07/1991  . INFLUENZA VACCINE  04/26/2016  . TETANUS/TDAP  03/25/2017 (Originally 03/06/1943)  . PNA vac Low Risk Adult (2 of 2 - PPSV23) 03/25/2017 (Originally 09/26/2002)  . ZOSTAVAX  Completed    Allergies  Allergen Reactions  . Amoxicillin     REACTION: unspecified  . Celebrex [Celecoxib]   . Codeine Sulfate     REACTION: unspecified  . Naproxen Sodium     REACTION: unspecified  . Reminyl [Galantamine Hydrobromide]   . Versed [Midazolam]   . Vioxx [Rofecoxib]       Medication List       Accurate as of 04/28/16 11:14 AM. Always use your most recent med list.          acetaminophen 325 MG tablet Commonly known as:  TYLENOL Take 650 mg by mouth 3 (three) times daily. Take 2 tabs by mouth daily as needed for pain ( not to exceed 3 gm)   NUTRITIONAL SUPPLEMENT PO Take by mouth. Puree with honey thick liquids   polyethylene glycol  packet Commonly known as:  MIRALAX / GLYCOLAX Take 17 g by mouth daily. Fill cap to 17 gm mark, mix with 4 ounces of fluid and take by mouth daily.   sennosides-docusate sodium 8.6-50 MG tablet Commonly known as:  SENOKOT-S Take 2 tablets by mouth daily.       Review of Systems  Constitutional: Negative for chills, diaphoresis and fever.  HENT: Positive for hearing loss. Negative for congestion, ear discharge, ear pain, nosebleeds, sore throat and tinnitus.   Eyes: Negative for photophobia, pain, discharge and redness.  Respiratory: Negative for cough, shortness of breath, wheezing and stridor.   Cardiovascular: Negative  for chest pain, palpitations and leg swelling.  Gastrointestinal: Negative for abdominal pain, blood in stool, constipation, diarrhea, nausea and vomiting.  Endocrine: Negative for polydipsia.  Genitourinary: Positive for frequency. Negative for dysuria, flank pain, hematuria and urgency.       Urinary incontinence  Musculoskeletal: Positive for back pain. Negative for myalgias and neck pain.       Wheelchair dependent  Skin: Negative for rash.       Probable basal cell cancer of the right cheek   Allergic/Immunologic: Negative for environmental allergies.  Neurological: Negative for dizziness, tremors, seizures, weakness and headaches.       Severe dementia  Hematological: Does not bruise/bleed easily.  Psychiatric/Behavioral: Positive for confusion and decreased concentration. Negative for hallucinations and suicidal ideas. The patient is nervous/anxious.     Vitals:   04/28/16 1103  BP: 122/72  Pulse: 74  Resp: 18  Temp: 97.8 F (36.6 C)  SpO2: 96%  Weight: 123 lb (55.8 kg)  Height: 5\' 1"  (1.549 m)   Body mass index is 23.24 kg/m. Physical Exam  Constitutional: She is oriented to person, place, and time. She appears well-developed and well-nourished. No distress.  HENT:  Head: Normocephalic and atraumatic.  Eyes: EOM are normal. Pupils are equal,  round, and reactive to light.  small pterygium  Neck: Normal range of motion. Neck supple. No JVD present. No thyromegaly present.  Cardiovascular: Normal rate and regular rhythm.   No murmur heard. Pulmonary/Chest: Effort normal and breath sounds normal. She has no wheezes. She has no rales.  Abdominal: Soft. Bowel sounds are normal. There is no tenderness.  Musculoskeletal: Normal range of motion. She exhibits no edema or tenderness.  W/c dependent. Chronic back pain is well managed with Tylenol 650mg  tid.   Lymphadenopathy:    She has no cervical adenopathy.  Neurological: She is alert and oriented to person, place, and time. She displays normal reflexes. No cranial nerve deficit. She exhibits normal muscle tone. Coordination normal.  Skin: Skin is warm and dry. No rash noted. She is not diaphoretic.  Probable basal cell cancer of the right cheek with some slow growth.  Psychiatric: Thought content normal. Her mood appears anxious. Her affect is inappropriate. Her speech is tangential. She is agitated and combative. Cognition and memory are impaired. She expresses impulsivity and inappropriate judgment. She exhibits abnormal recent memory and abnormal remote memory.  Grimacing, gnashing teeth, and yelling frequently.    Labs reviewed: Basic Metabolic Panel: No results for input(s): NA, K, CL, CO2, GLUCOSE, BUN, CREATININE, CALCIUM, MG, PHOS in the last 8760 hours. Liver Function Tests: No results for input(s): AST, ALT, ALKPHOS, BILITOT, PROT, ALBUMIN in the last 8760 hours. No results for input(s): LIPASE, AMYLASE in the last 8760 hours. No results for input(s): AMMONIA in the last 8760 hours. CBC: No results for input(s): WBC, NEUTROABS, HGB, HCT, MCV, PLT in the last 8760 hours. Cardiac Enzymes: No results for input(s): CKTOTAL, CKMB, CKMBINDEX, TROPONINI in the last 8760 hours. BNP: Invalid input(s): POCBNP No results found for: HGBA1C Lab Results  Component Value Date   TSH  1.48 07/26/2012   Lab Results  Component Value Date   VITAMINB12 236 09/12/2006   Lab Results  Component Value Date   FOLATE 17.3 09/12/2006    Assessment/Plan 1. Late onset Alzheimer's disease with behavioral disturbance Slow decline in memory. Behavioral issues remain.  2. Basal cell carcinoma of face No action being taken because of declining condition of patient.  3. Essential hypertension  Controlled  4. Hx of seizure disorder Controlled with no seizures in years

## 2016-04-30 ENCOUNTER — Encounter: Payer: Self-pay | Admitting: Internal Medicine

## 2016-04-30 DIAGNOSIS — F0280 Dementia in other diseases classified elsewhere without behavioral disturbance: Secondary | ICD-10-CM | POA: Insufficient documentation

## 2016-04-30 DIAGNOSIS — G309 Alzheimer's disease, unspecified: Secondary | ICD-10-CM

## 2016-05-03 ENCOUNTER — Encounter: Payer: Self-pay | Admitting: Nurse Practitioner

## 2016-05-03 ENCOUNTER — Non-Acute Institutional Stay (SKILLED_NURSING_FACILITY): Payer: Medicare Other | Admitting: Nurse Practitioner

## 2016-05-03 DIAGNOSIS — H00016 Hordeolum externum left eye, unspecified eyelid: Secondary | ICD-10-CM

## 2016-05-03 DIAGNOSIS — Z8669 Personal history of other diseases of the nervous system and sense organs: Secondary | ICD-10-CM | POA: Diagnosis not present

## 2016-05-03 DIAGNOSIS — G308 Other Alzheimer's disease: Secondary | ICD-10-CM

## 2016-05-03 DIAGNOSIS — I10 Essential (primary) hypertension: Secondary | ICD-10-CM | POA: Diagnosis not present

## 2016-05-03 DIAGNOSIS — K219 Gastro-esophageal reflux disease without esophagitis: Secondary | ICD-10-CM | POA: Diagnosis not present

## 2016-05-03 DIAGNOSIS — K5900 Constipation, unspecified: Secondary | ICD-10-CM

## 2016-05-03 DIAGNOSIS — F0280 Dementia in other diseases classified elsewhere without behavioral disturbance: Secondary | ICD-10-CM | POA: Diagnosis not present

## 2016-05-03 DIAGNOSIS — F320 Major depressive disorder, single episode, mild: Secondary | ICD-10-CM | POA: Diagnosis not present

## 2016-05-03 DIAGNOSIS — H00019 Hordeolum externum unspecified eye, unspecified eyelid: Secondary | ICD-10-CM | POA: Insufficient documentation

## 2016-05-03 NOTE — Assessment & Plan Note (Signed)
Stable, continue MiraLax qod prn and Senokot S II qhs.

## 2016-05-03 NOTE — Assessment & Plan Note (Signed)
mood is stabilized, off meds

## 2016-05-03 NOTE — Assessment & Plan Note (Signed)
Stable, off Famotidine 20mg 

## 2016-05-03 NOTE — Assessment & Plan Note (Signed)
Mood is stable, except occasional outburst but easily redirected.

## 2016-05-03 NOTE — Assessment & Plan Note (Signed)
Controlled. Not taking antihypertensive agents.   

## 2016-05-03 NOTE — Assessment & Plan Note (Signed)
Left upper eyelid, apply Erythromycin ophthalmic ointment 0.5% bid to the left eye x 1week. Observe.

## 2016-05-03 NOTE — Progress Notes (Signed)
Location:   [facility] Room Number: N-33 Place of Service:  SNF (31) Provider:  Alexios Keown  NP   Patient Care Team: Estill Dooms, MD as PCP - General (Internal Medicine) Clent Jacks, MD as Consulting Physician (Ophthalmology) Gatha Mayer, MD as Consulting Physician (Gastroenterology) Nicholaus Bloom, MD as Consulting Physician (Pain Medicine)  Extended Emergency Contact Information Primary Emergency Contact: [facility] Address: [address]          [place] Home Phone: [phone] Relation: None  Code Status:  DNR   Goals of care: Advanced Directive information Advanced Directives 05/03/2016  Does patient have an advance directive? Yes  Type of Paramedic of [place];Living will;Out of facility DNR (pink MOST or yellow form)  Does patient want to make changes to advanced directive? No - Patient declined  Copy of advanced directive(s) in chart? Yes  Pre-existing out of facility DNR order (yellow form or pink MOST form) -     Chief Complaint  Patient presents with  . Acute Visit    Rt upper eyelid-redness, puffy and watering. (? style)    HPI:  Pt is a 82 y.o. female seen today for an acute visit for left upper eyelid redness, swelling, no vision change or conjunctival erythema noted.     Hx of HTN, normal Bp, off meds. Her chronic back pain is better managed since she is no longer ambulating, scheduled Tylenol is adequate for pain management. She has end stage of dementia, total dependent of her ADLs except feeding self sometimes. Her mood is stable except sometimes she is agitated and combative due to her lack of understanding of surroundings.      Past Medical History:  Diagnosis Date  . Alzheimer's disease   . Backache   . Basal cell carcinoma of face 03/23/2015   Left lower cheek and right upper cheek   . Colon polyp   . Constipation 03/06/2013   03/19/14: Senokot S II qhs. Continue MiraLax  qod   . Dementia in chronic affective disorder ([place]) 06/20/2007   Qualifier: Diagnosis of  By: Leanne Chang MD, Bruce  MMSE 9/30 02/07/14 Off Namenda and Aricept 02/26/14 04/02/14 Depakote 250mg  bid 04/07/14 hold Buspirone, Depakote, Tramadol(dc'd Buspirone and changed Tramadol prn subsequently) 12/08/14 reduce Depakote from 250mg  bid to 125mg  am and 250mg  pm.  01/12/15 dc am Depakote and continue 250mg  pm.     . Depression   . Diverticulosis   . Esophageal reflux 12/26/2012  . GERD (gastroesophageal reflux disease)   . Hx of seizure disorder 12/04/2013  . Hyperlipidemia   . Hypertension   . Major depressive disorder, single episode 12/26/2012  . Osteoarthritis   . Osteoporosis    07/23/09 x-ray of the lumbar and thoracic spine showed an L1/L2 mild anterior wedge deformity  . Pterygium of right eye 10/14/2015  . Seizure disorder ([place])   . Spinal stenosis   . Unstable gait    Past Surgical History:  Procedure Laterality Date  . CATARACT EXTRACTION     Dr. Katy Fitch  . COLONOSCOPY  02/01/2002   AVM of the ascending colon, 3 mm polyp, AVM and transverse colon, diverticulosis  . EPIDURAL BLOCK INJECTION  2007  . ESOPHAGOGASTRODUODENOSCOPY  07/05/2005   Esophageal stricture, 4 cm hiatal hernia  . TONSILLECTOMY AND ADENOIDECTOMY Bilateral 1933    Allergies  Allergen Reactions  . Amoxicillin     REACTION: unspecified  . Celebrex [Celecoxib]   . Codeine Sulfate  REACTION: unspecified  . Naproxen Sodium     REACTION: unspecified  . Reminyl [Galantamine Hydrobromide]   . Versed [Midazolam]   . Vioxx [Rofecoxib]       Medication List       Accurate as of 05/03/16  6:23 PM. Always use your most recent med list.          acetaminophen 325 MG tablet Commonly known as:  TYLENOL Take 650 mg by mouth 3 (three) times daily. Take 2 tabs by mouth daily as needed for pain ( not to exceed 3 gm)   NUTRITIONAL SUPPLEMENT PO Take by mouth. Puree with honey thick liquids   polyethylene glycol  packet Commonly known as:  MIRALAX / GLYCOLAX Take 17 g by mouth daily. Fill cap to 17 gm mark, mix with 4 ounces of fluid and take by mouth daily.   sennosides-docusate sodium 8.6-50 MG tablet Commonly known as:  SENOKOT-S Take 2 tablets by mouth daily.       Review of Systems  Constitutional: Negative for chills, diaphoresis and fever.  HENT: Positive for hearing loss. Negative for congestion, ear discharge, ear pain, nosebleeds, sore throat and tinnitus.   Eyes: Negative for photophobia, pain, discharge and redness.       Left upper eyelid-stye  Respiratory: Negative for cough, shortness of breath, wheezing and stridor.   Cardiovascular: Negative for chest pain, palpitations and leg swelling.  Gastrointestinal: Negative for abdominal pain, blood in stool, constipation, diarrhea, nausea and vomiting.  Endocrine: Negative for polydipsia.  Genitourinary: Positive for frequency. Negative for dysuria, flank pain, hematuria and urgency.       Urinary incontinence  Musculoskeletal: Positive for back pain. Negative for myalgias and neck pain.       Wheelchair dependent  Skin: Negative for rash.       Probable basal cell cancer of the right cheek   Allergic/Immunologic: Negative for environmental allergies.  Neurological: Negative for dizziness, tremors, seizures, weakness and headaches.       Severe dementia  Hematological: Does not bruise/bleed easily.  Psychiatric/Behavioral: Positive for confusion and decreased concentration. Negative for hallucinations and suicidal ideas. The patient is nervous/anxious.     Immunization History  Administered Date(s) Administered  . Influenza Whole 07/06/2007, 06/18/2008  . Influenza-Unspecified 07/30/2014, 06/16/2015  . PPD Test 02/14/2009  . Pneumococcal Conjugate-13 09/26/2001  . Zoster 04/11/2007   Pertinent  Health Maintenance Due  Topic Date Due  . DEXA SCAN  03/07/1991  . INFLUENZA VACCINE  04/26/2016  . PNA vac Low Risk Adult (2 of  2 - PPSV23) 03/25/2017 (Originally 09/26/2002)   Fall Risk  03/23/2015  Falls in the past year? No   Functional Status Survey:    Vitals:   05/03/16 0930  BP: 122/66  Pulse: 72  Resp: 18  Temp: 98.1 F (36.7 C)  Weight: 123 lb (55.8 kg)  Height: 5\' 1"  (1.549 m)   Body mass index is 23.24 kg/m. Physical Exam  Constitutional: She is oriented to person, place, and time. She appears well-developed and well-nourished. No distress.  HENT:  Head: Normocephalic and atraumatic.  Eyes: EOM are normal. Pupils are equal, round, and reactive to light.  small pterygium, Left upper eyelid-stye   Neck: Normal range of motion. Neck supple. No JVD present. No thyromegaly present.  Cardiovascular: Normal rate and regular rhythm.   No murmur heard. Pulmonary/Chest: Effort normal and breath sounds normal. She has no wheezes. She has no rales.  Abdominal: Soft. Bowel sounds are normal. There is  no tenderness.  Musculoskeletal: Normal range of motion. She exhibits no edema or tenderness.  W/c dependent. Chronic back pain is well managed with Tylenol 650mg  tid.   Lymphadenopathy:    She has no cervical adenopathy.  Neurological: She is alert and oriented to person, place, and time. She displays normal reflexes. No cranial nerve deficit. She exhibits normal muscle tone. Coordination normal.  Skin: Skin is warm and dry. No rash noted. She is not diaphoretic.  Probable basal cell cancer of the right cheek with some slow growth.  Psychiatric: Thought content normal. Her mood appears anxious. Her affect is inappropriate. Her speech is tangential. She is agitated and combative. Cognition and memory are impaired. She expresses impulsivity and inappropriate judgment. She exhibits abnormal recent memory and abnormal remote memory.  Grimacing, gnashing teeth, and yelling frequently.    Labs reviewed: No results for input(s): NA, K, CL, CO2, GLUCOSE, BUN, CREATININE, CALCIUM, MG, PHOS in the last 8760  hours. No results for input(s): AST, ALT, ALKPHOS, BILITOT, PROT, ALBUMIN in the last 8760 hours. No results for input(s): WBC, NEUTROABS, HGB, HCT, MCV, PLT in the last 8760 hours. Lab Results  Component Value Date   TSH 1.48 07/26/2012   No results found for: HGBA1C Lab Results  Component Value Date   CHOL 222 (HH) 10/19/2007   HDL 102.5 10/19/2007   LDLDIRECT 103.3 10/19/2007   TRIG 60 10/19/2007   CHOLHDL 2.2 CALC 10/19/2007    Significant Diagnostic Results in last 30 days:  No results found.  Assessment/Plan There are no diagnoses linked to this encounter.Hordeolum externum (stye) Left upper eyelid, apply Erythromycin ophthalmic ointment 0.5% bid to the left eye x 1week. Observe.   Constipation Stable, continue MiraLax qod prn and Senokot S II qhs.    Essential hypertension Controlled. Not taking antihypertensive agents.  Esophageal reflux Stable, off Famotidine 20mg   Alzheimer's disease mood is stabilized, off meds   Major depressive disorder, single episode Mood is stable, except occasional outburst but easily redirected.   Hx of seizure disorder Continue to be seizure free.      Family/ staff Communication: continue SNF for care assistance  Labs/tests ordered:  none

## 2016-05-03 NOTE — Assessment & Plan Note (Signed)
Continue to be seizure free.

## 2016-05-31 ENCOUNTER — Encounter: Payer: Self-pay | Admitting: Nurse Practitioner

## 2016-05-31 ENCOUNTER — Non-Acute Institutional Stay (SKILLED_NURSING_FACILITY): Payer: Medicare Other | Admitting: Nurse Practitioner

## 2016-05-31 DIAGNOSIS — M5440 Lumbago with sciatica, unspecified side: Secondary | ICD-10-CM | POA: Diagnosis not present

## 2016-05-31 DIAGNOSIS — K5900 Constipation, unspecified: Secondary | ICD-10-CM

## 2016-05-31 DIAGNOSIS — F324 Major depressive disorder, single episode, in partial remission: Secondary | ICD-10-CM

## 2016-05-31 DIAGNOSIS — G308 Other Alzheimer's disease: Secondary | ICD-10-CM | POA: Diagnosis not present

## 2016-05-31 DIAGNOSIS — K219 Gastro-esophageal reflux disease without esophagitis: Secondary | ICD-10-CM | POA: Diagnosis not present

## 2016-05-31 DIAGNOSIS — Z8669 Personal history of other diseases of the nervous system and sense organs: Secondary | ICD-10-CM | POA: Diagnosis not present

## 2016-05-31 DIAGNOSIS — F0280 Dementia in other diseases classified elsewhere without behavioral disturbance: Secondary | ICD-10-CM

## 2016-05-31 DIAGNOSIS — I10 Essential (primary) hypertension: Secondary | ICD-10-CM | POA: Diagnosis not present

## 2016-05-31 NOTE — Assessment & Plan Note (Signed)
Controlled, takesTylenl 650mg tid   

## 2016-05-31 NOTE — Assessment & Plan Note (Signed)
Continue to be seizure free.

## 2016-05-31 NOTE — Progress Notes (Signed)
Location:   [facility] Room Number: 69 Place of Service:  SNF (31) Provider:  Mast, Manxie  NP  Jeanmarie Hubert, MD  Patient Care Team: Estill Dooms, MD as PCP - General (Internal Medicine) Clent Jacks, MD as Consulting Physician (Ophthalmology) Gatha Mayer, MD as Consulting Physician (Gastroenterology) Nicholaus Bloom, MD as Consulting Physician (Pain Medicine) Man Otho Darner, NP as Nurse Practitioner (Internal Medicine)  Extended Emergency Contact Information Primary Emergency Contact: [facility] Address: [place]          [place],  [place] Home Phone: [phone] Relation: None  Code Status:  DNR Goals of care: Advanced Directive information Advanced Directives 05/31/2016  Does patient have an advance directive? Yes  Type of Paramedic of [place];Living will;Out of facility DNR (pink MOST or yellow form)  Does patient want to make changes to advanced directive? No - Patient declined  Copy of advanced directive(s) in chart? Yes  Pre-existing out of facility DNR order (yellow form or pink MOST form) -     Chief Complaint  Patient presents with  . Medical Management of Chronic Issues    HPI:  Pt is a 82 y.o. female seen today for medical management of chronic diseases.    Hx of HTN, normal Bp, off meds. Her chronic back pain is better managed since she is no longer ambulating, scheduled Tylenol is adequate for pain management. She has end stage of dementia, total dependent of her ADLs except feeding self sometimes. Her mood is stable except sometimes she is agitated and combative due to her lack of understanding of surroundings.   Past Medical History:  Diagnosis Date  . Alzheimer's disease   . Backache   . Basal cell carcinoma of face 03/23/2015   Left lower cheek and right upper cheek   . Colon polyp   . Constipation 03/06/2013   03/19/14: Senokot S II qhs. Continue MiraLax qod   . Dementia in chronic  affective disorder ([place]) 06/20/2007   Qualifier: Diagnosis of  By: Leanne Chang MD, Bruce  MMSE 9/30 02/07/14 Off Namenda and Aricept 02/26/14 04/02/14 Depakote 250mg  bid 04/07/14 hold Buspirone, Depakote, Tramadol(dc'd Buspirone and changed Tramadol prn subsequently) 12/08/14 reduce Depakote from 250mg  bid to 125mg  am and 250mg  pm.  01/12/15 dc am Depakote and continue 250mg  pm.     . Depression   . Diverticulosis   . Esophageal reflux 12/26/2012  . GERD (gastroesophageal reflux disease)   . Hx of seizure disorder 12/04/2013  . Hyperlipidemia   . Hypertension   . Major depressive disorder, single episode 12/26/2012  . Osteoarthritis   . Osteoporosis    07/23/09 x-ray of the lumbar and thoracic spine showed an L1/L2 mild anterior wedge deformity  . Pterygium of right eye 10/14/2015  . Seizure disorder ([place])   . Spinal stenosis   . Unstable gait    Past Surgical History:  Procedure Laterality Date  . CATARACT EXTRACTION     Dr. Katy Fitch  . COLONOSCOPY  02/01/2002   AVM of the ascending colon, 3 mm polyp, AVM and transverse colon, diverticulosis  . EPIDURAL BLOCK INJECTION  2007  . ESOPHAGOGASTRODUODENOSCOPY  07/05/2005   Esophageal stricture, 4 cm hiatal hernia  . TONSILLECTOMY AND ADENOIDECTOMY Bilateral 1933    Allergies  Allergen Reactions  . Amoxicillin     REACTION: unspecified  . Celebrex [Celecoxib]   . Codeine Sulfate     REACTION: unspecified  . Naproxen Sodium  REACTION: unspecified  . Reminyl [Galantamine Hydrobromide]   . Versed [Midazolam]   . Vioxx [Rofecoxib]       Medication List       Accurate as of 05/31/16  4:29 PM. Always use your most recent med list.          acetaminophen 325 MG tablet Commonly known as:  TYLENOL Take 650 mg by mouth 3 (three) times daily. Take 2 tabs by mouth daily as needed for pain ( not to exceed 3 gm)   polyethylene glycol packet Commonly known as:  MIRALAX / GLYCOLAX Take 17 g by mouth daily. Fill cap to 17 gm mark, mix with 4 ounces of  fluid and take by mouth daily.   sennosides-docusate sodium 8.6-50 MG tablet Commonly known as:  SENOKOT-S Take 2 tablets by mouth daily.       Review of Systems  Constitutional: Negative for chills, diaphoresis and fever.  HENT: Positive for hearing loss. Negative for congestion, ear discharge, ear pain, nosebleeds, sore throat and tinnitus.   Eyes: Negative for photophobia, pain, discharge and redness.  Respiratory: Negative for cough, shortness of breath, wheezing and stridor.   Cardiovascular: Negative for chest pain, palpitations and leg swelling.  Gastrointestinal: Negative for abdominal pain, blood in stool, constipation, diarrhea, nausea and vomiting.  Endocrine: Negative for polydipsia.  Genitourinary: Positive for frequency. Negative for dysuria, flank pain, hematuria and urgency.       Urinary incontinence  Musculoskeletal: Positive for back pain. Negative for myalgias and neck pain.       Wheelchair dependent  Skin: Negative for rash.       Probable basal cell cancer of the right cheek   Allergic/Immunologic: Negative for environmental allergies.  Neurological: Negative for dizziness, tremors, seizures, weakness and headaches.       Severe dementia  Hematological: Does not bruise/bleed easily.  Psychiatric/Behavioral: Positive for confusion and decreased concentration. Negative for hallucinations and suicidal ideas. The patient is nervous/anxious.     Immunization History  Administered Date(s) Administered  . Influenza Whole 07/06/2007, 06/18/2008  . Influenza-Unspecified 07/30/2014, 06/16/2015  . PPD Test 02/14/2009  . Pneumococcal Conjugate-13 09/26/2001  . Zoster 04/11/2007   Pertinent  Health Maintenance Due  Topic Date Due  . DEXA SCAN  03/07/1991  . INFLUENZA VACCINE  04/26/2016  . PNA vac Low Risk Adult (2 of 2 - PPSV23) 03/25/2017 (Originally 09/26/2002)   Fall Risk  03/23/2015  Falls in the past year? No   Functional Status Survey:    Vitals:    05/31/16 1236  BP: 128/74  Pulse: 76  Resp: 16  Temp: 98 F (36.7 C)  SpO2: 94%  Weight: 123 lb 6.4 oz (56 kg)  Height: 5\' 1"  (1.549 m)   Body mass index is 23.32 kg/m. Physical Exam  Constitutional: She is oriented to person, place, and time. She appears well-developed and well-nourished. No distress.  HENT:  Head: Normocephalic and atraumatic.  Eyes: EOM are normal. Pupils are equal, round, and reactive to light.     Neck: Normal range of motion. Neck supple. No JVD present. No thyromegaly present.  Cardiovascular: Normal rate and regular rhythm.   No murmur heard. Pulmonary/Chest: Effort normal and breath sounds normal. She has no wheezes. She has no rales.  Abdominal: Soft. Bowel sounds are normal. There is no tenderness.  Musculoskeletal: Normal range of motion. She exhibits no edema or tenderness.  W/c dependent. Chronic back pain is well managed with Tylenol 650mg  tid.   Lymphadenopathy:  She has no cervical adenopathy.  Neurological: She is alert and oriented to person, place, and time. She displays normal reflexes. No cranial nerve deficit. She exhibits normal muscle tone. Coordination normal.  Skin: Skin is warm and dry. No rash noted. She is not diaphoretic.  Probable basal cell cancer of the right cheek with some slow growth.  Psychiatric: Thought content normal. Her mood appears anxious. Her affect is inappropriate. Her speech is tangential. She is agitated and combative. Cognition and memory are impaired. She expresses impulsivity and inappropriate judgment. She exhibits abnormal recent memory and abnormal remote memory.  Grimacing, gnashing teeth, and yelling frequently.    Labs reviewed: No results for input(s): NA, K, CL, CO2, GLUCOSE, BUN, CREATININE, CALCIUM, MG, PHOS in the last 8760 hours. No results for input(s): AST, ALT, ALKPHOS, BILITOT, PROT, ALBUMIN in the last 8760 hours. No results for input(s): WBC, NEUTROABS, HGB, HCT, MCV, PLT in the last 8760  hours. Lab Results  Component Value Date   TSH 1.48 07/26/2012   No results found for: HGBA1C Lab Results  Component Value Date   CHOL 222 (HH) 10/19/2007   HDL 102.5 10/19/2007   LDLDIRECT 103.3 10/19/2007   TRIG 60 10/19/2007   CHOLHDL 2.2 CALC 10/19/2007    Significant Diagnostic Results in last 30 days:  No results found.  Assessment/Plan There are no diagnoses linked to this encounter.Essential hypertension Controlled. Not taking antihypertensive agents.   Esophageal reflux Stable, off Famotidine 20mg    Constipation Stable, continue MiraLax qod prn and Senokot S II qhs.    Alzheimer's disease mood is stabilized, off meds   Major depressive disorder, single episode Mood is stable, except occasional outburst but easily redirected.    Hx of seizure disorder Continue to be seizure free.    Lower back pain Controlled, takesTylenl 650mg  tid      Family/ staff Communication: continue SNF for care assistance.   Labs/tests ordered:  none

## 2016-05-31 NOTE — Assessment & Plan Note (Signed)
Mood is stable, except occasional outburst but easily redirected.

## 2016-05-31 NOTE — Assessment & Plan Note (Signed)
Stable, continue MiraLax qod prn and Senokot S II qhs.

## 2016-05-31 NOTE — Assessment & Plan Note (Signed)
mood is stabilized, off meds

## 2016-05-31 NOTE — Assessment & Plan Note (Signed)
Controlled. Not taking antihypertensive agents.   

## 2016-05-31 NOTE — Assessment & Plan Note (Signed)
Stable, off Famotidine 20mg 

## 2016-06-09 ENCOUNTER — Encounter: Payer: Self-pay | Admitting: Nurse Practitioner

## 2016-06-09 ENCOUNTER — Non-Acute Institutional Stay (SKILLED_NURSING_FACILITY): Payer: Medicare Other | Admitting: Nurse Practitioner

## 2016-06-09 DIAGNOSIS — Z8669 Personal history of other diseases of the nervous system and sense organs: Secondary | ICD-10-CM | POA: Diagnosis not present

## 2016-06-09 DIAGNOSIS — K5900 Constipation, unspecified: Secondary | ICD-10-CM

## 2016-06-09 DIAGNOSIS — S9032XA Contusion of left foot, initial encounter: Secondary | ICD-10-CM

## 2016-06-09 DIAGNOSIS — F0280 Dementia in other diseases classified elsewhere without behavioral disturbance: Secondary | ICD-10-CM | POA: Diagnosis not present

## 2016-06-09 DIAGNOSIS — G308 Other Alzheimer's disease: Secondary | ICD-10-CM

## 2016-06-09 DIAGNOSIS — I10 Essential (primary) hypertension: Secondary | ICD-10-CM

## 2016-06-09 DIAGNOSIS — K219 Gastro-esophageal reflux disease without esophagitis: Secondary | ICD-10-CM | POA: Diagnosis not present

## 2016-06-09 NOTE — Assessment & Plan Note (Signed)
Stable, off Famotidine 20mg 

## 2016-06-09 NOTE — Assessment & Plan Note (Signed)
Stable, continue MiraLax qod prn and Senokot S II qhs.

## 2016-06-09 NOTE — Assessment & Plan Note (Signed)
Continue to be seizure free.

## 2016-06-09 NOTE — Assessment & Plan Note (Signed)
Left foot bruise, mild tenderness and swelling noted, X-ray L foot 06/08/16 no acute foot fracture or dislocation. Apply BioFreeze qid left foot x 2weeks.

## 2016-06-09 NOTE — Assessment & Plan Note (Signed)
Controlled. Not taking antihypertensive agents.   

## 2016-06-09 NOTE — Progress Notes (Signed)
Location:   [facility] of Service:   SNF  Provider:  Mast, Manxie  NP  Jeanmarie Hubert, MD  Patient Care Team: Estill Dooms, MD as PCP - General (Internal Medicine) Clent Jacks, MD as Consulting Physician (Ophthalmology) Gatha Mayer, MD as Consulting Physician (Gastroenterology) Nicholaus Bloom, MD as Consulting Physician (Pain Medicine) Man Otho Darner, NP as Nurse Practitioner (Internal Medicine)  Extended Emergency Contact Information Primary Emergency Contact: [facility] Address: [address]          [place],  [place] Home Phone: [phone] Relation: None  Code Status:  DNR Goals of care: Advanced Directive information Advanced Directives 05/31/2016  Does patient have an advance directive? Yes  Type of Paramedic of [place];Living will;Out of facility DNR (pink MOST or yellow form)  Does patient want to make changes to advanced directive? No - Patient declined  Copy of advanced directive(s) in chart? Yes  Pre-existing out of facility DNR order (yellow form or pink MOST form) -     Chief Complaint  Patient presents with  . Acute Visit    left foot contusion    HPI:  Pt is a 82 y.o. female seen today for medical management of chronic diseases.     Left foot bruise, mild tenderness and swelling noted, X-ray L foot 06/08/16 no acute foot fracture or dislocation.    Hx of HTN, normal Bp, off meds. Her chronic back pain is better managed since she is no longer ambulating, scheduled Tylenol is adequate for pain management. She has end stage of dementia, total dependent of her ADLs except feeding self sometimes. Her mood is stable except sometimes she is agitated and combative due to her lack of understanding of surroundings.   Past Medical History:  Diagnosis Date  . Alzheimer's disease   . Backache   . Basal cell carcinoma of face 03/23/2015   Left lower cheek and right upper cheek   . Colon polyp   . Constipation  03/06/2013   03/19/14: Senokot S II qhs. Continue MiraLax qod   . Dementia in chronic affective disorder ([place]) 06/20/2007   Qualifier: Diagnosis of  By: Leanne Chang MD, Bruce  MMSE 9/30 02/07/14 Off Namenda and Aricept 02/26/14 04/02/14 Depakote 250mg  bid 04/07/14 hold Buspirone, Depakote, Tramadol(dc'd Buspirone and changed Tramadol prn subsequently) 12/08/14 reduce Depakote from 250mg  bid to 125mg  am and 250mg  pm.  01/12/15 dc am Depakote and continue 250mg  pm.     . Depression   . Diverticulosis   . Esophageal reflux 12/26/2012  . GERD (gastroesophageal reflux disease)   . Hx of seizure disorder 12/04/2013  . Hyperlipidemia   . Hypertension   . Major depressive disorder, single episode 12/26/2012  . Osteoarthritis   . Osteoporosis    07/23/09 x-ray of the lumbar and thoracic spine showed an L1/L2 mild anterior wedge deformity  . Pterygium of right eye 10/14/2015  . Seizure disorder ([place])   . Spinal stenosis   . Unstable gait    Past Surgical History:  Procedure Laterality Date  . CATARACT EXTRACTION     Dr. Katy Fitch  . COLONOSCOPY  02/01/2002   AVM of the ascending colon, 3 mm polyp, AVM and transverse colon, diverticulosis  . EPIDURAL BLOCK INJECTION  2007  . ESOPHAGOGASTRODUODENOSCOPY  07/05/2005   Esophageal stricture, 4 cm hiatal hernia  . TONSILLECTOMY AND ADENOIDECTOMY Bilateral 1933    Allergies  Allergen Reactions  . Amoxicillin     REACTION:  unspecified  . Celebrex [Celecoxib]   . Codeine Sulfate     REACTION: unspecified  . Naproxen Sodium     REACTION: unspecified  . Reminyl [Galantamine Hydrobromide]   . Versed [Midazolam]   . Vioxx [Rofecoxib]       Medication List       Accurate as of 06/09/16  1:54 PM. Always use your most recent med list.          acetaminophen 325 MG tablet Commonly known as:  TYLENOL Take 650 mg by mouth 3 (three) times daily. Take 2 tabs by mouth daily as needed for pain ( not to exceed 3 gm)   polyethylene glycol packet Commonly known as:   MIRALAX / GLYCOLAX Take 17 g by mouth daily. Fill cap to 17 gm mark, mix with 4 ounces of fluid and take by mouth daily.   sennosides-docusate sodium 8.6-50 MG tablet Commonly known as:  SENOKOT-S Take 2 tablets by mouth daily.       Review of Systems  Constitutional: Negative for chills, diaphoresis and fever.  HENT: Positive for hearing loss. Negative for congestion, ear discharge, ear pain, nosebleeds, sore throat and tinnitus.   Eyes: Negative for photophobia, pain, discharge and redness.  Respiratory: Negative for cough, shortness of breath, wheezing and stridor.   Cardiovascular: Negative for chest pain, palpitations and leg swelling.  Gastrointestinal: Negative for abdominal pain, blood in stool, constipation, diarrhea, nausea and vomiting.  Endocrine: Negative for polydipsia.  Genitourinary: Positive for frequency. Negative for dysuria, flank pain, hematuria and urgency.       Urinary incontinence  Musculoskeletal: Positive for back pain. Negative for myalgias and neck pain.       Wheelchair dependent  Skin: Negative for rash.       Probable basal cell cancer of the right cheek. Left foot bruise, mild swelling, tenderness  Allergic/Immunologic: Negative for environmental allergies.  Neurological: Negative for dizziness, tremors, seizures, weakness and headaches.       Severe dementia  Hematological: Does not bruise/bleed easily.  Psychiatric/Behavioral: Positive for confusion and decreased concentration. Negative for hallucinations and suicidal ideas. The patient is nervous/anxious.     Immunization History  Administered Date(s) Administered  . Influenza Whole 07/06/2007, 06/18/2008  . Influenza-Unspecified 07/30/2014, 06/16/2015  . PPD Test 02/14/2009  . Pneumococcal Conjugate-13 09/26/2001  . Zoster 04/11/2007   Pertinent  Health Maintenance Due  Topic Date Due  . DEXA SCAN  03/07/1991  . INFLUENZA VACCINE  04/26/2016  . PNA vac Low Risk Adult (2 of 2 - PPSV23)  03/25/2017 (Originally 09/26/2002)   Fall Risk  03/23/2015  Falls in the past year? No   Functional Status Survey:    Vitals:   06/09/16 1346  BP: 118/64  Pulse: 72  Resp: 20  Temp: 97.1 F (36.2 C)  SpO2: 98%   There is no height or weight on file to calculate BMI. Physical Exam  Constitutional: She is oriented to person, place, and time. She appears well-developed and well-nourished. No distress.  HENT:  Head: Normocephalic and atraumatic.  Eyes: EOM are normal. Pupils are equal, round, and reactive to light.     Neck: Normal range of motion. Neck supple. No JVD present. No thyromegaly present.  Cardiovascular: Normal rate and regular rhythm.   No murmur heard. Pulmonary/Chest: Effort normal and breath sounds normal. She has no wheezes. She has no rales.  Abdominal: Soft. Bowel sounds are normal. There is no tenderness.  Musculoskeletal: Normal range of motion. She exhibits no  edema or tenderness.  W/c dependent. Chronic back pain is well managed with Tylenol 650mg  tid.   Lymphadenopathy:    She has no cervical adenopathy.  Neurological: She is alert and oriented to person, place, and time. She displays normal reflexes. No cranial nerve deficit. She exhibits normal muscle tone. Coordination normal.  Skin: Skin is warm and dry. No rash noted. She is not diaphoretic.  Probable basal cell cancer of the right cheek with some slow growth. Left foot bruise, mild tenderness and swelling.   Psychiatric: Thought content normal. Her mood appears anxious. Her affect is inappropriate. Her speech is tangential. She is agitated and combative. Cognition and memory are impaired. She expresses impulsivity and inappropriate judgment. She exhibits abnormal recent memory and abnormal remote memory.  Grimacing, gnashing teeth, and yelling frequently.    Labs reviewed: No results for input(s): NA, K, CL, CO2, GLUCOSE, BUN, CREATININE, CALCIUM, MG, PHOS in the last 8760 hours. No results for  input(s): AST, ALT, ALKPHOS, BILITOT, PROT, ALBUMIN in the last 8760 hours. No results for input(s): WBC, NEUTROABS, HGB, HCT, MCV, PLT in the last 8760 hours. Lab Results  Component Value Date   TSH 1.48 07/26/2012   No results found for: HGBA1C Lab Results  Component Value Date   CHOL 222 (HH) 10/19/2007   HDL 102.5 10/19/2007   LDLDIRECT 103.3 10/19/2007   TRIG 60 10/19/2007   CHOLHDL 2.2 CALC 10/19/2007    Significant Diagnostic Results in last 30 days:  No results found.  Assessment/Plan There are no diagnoses linked to this encounter.Contusion of left foot Left foot bruise, mild tenderness and swelling noted, X-ray L foot 06/08/16 no acute foot fracture or dislocation. Apply BioFreeze qid left foot x 2weeks.    Essential hypertension Controlled. Not taking antihypertensive agents.    Esophageal reflux Stable, off Famotidine 20mg     Constipation Stable, continue MiraLax qod prn and Senokot S II qhs.     Alzheimer's disease mood is stabilized, off meds    Hx of seizure disorder Continue to be seizure free.        Family/ staff Communication: continue SNF for care assistance.   Labs/tests ordered:  X-ray left foot done 06/08/16

## 2016-06-09 NOTE — Assessment & Plan Note (Signed)
mood is stabilized, off meds

## 2016-07-19 ENCOUNTER — Encounter: Payer: Self-pay | Admitting: Nurse Practitioner

## 2016-07-19 ENCOUNTER — Non-Acute Institutional Stay (SKILLED_NURSING_FACILITY): Payer: Medicare Other | Admitting: Nurse Practitioner

## 2016-07-19 DIAGNOSIS — M5440 Lumbago with sciatica, unspecified side: Secondary | ICD-10-CM | POA: Diagnosis not present

## 2016-07-19 DIAGNOSIS — G300 Alzheimer's disease with early onset: Secondary | ICD-10-CM | POA: Diagnosis not present

## 2016-07-19 DIAGNOSIS — K5900 Constipation, unspecified: Secondary | ICD-10-CM

## 2016-07-19 DIAGNOSIS — F0280 Dementia in other diseases classified elsewhere without behavioral disturbance: Secondary | ICD-10-CM

## 2016-07-19 DIAGNOSIS — Z8669 Personal history of other diseases of the nervous system and sense organs: Secondary | ICD-10-CM

## 2016-07-19 DIAGNOSIS — K219 Gastro-esophageal reflux disease without esophagitis: Secondary | ICD-10-CM

## 2016-07-19 DIAGNOSIS — I10 Essential (primary) hypertension: Secondary | ICD-10-CM | POA: Diagnosis not present

## 2016-07-19 DIAGNOSIS — S60221A Contusion of right hand, initial encounter: Secondary | ICD-10-CM

## 2016-07-19 NOTE — Progress Notes (Signed)
Location:  [facility] Room Number: 50 Place of Service:  SNF (31) Provider:  Osie Merkin, Manxie  NP  Jeanmarie Hubert, MD  Patient Care Team: Estill Dooms, MD as PCP - General (Internal Medicine) Clent Jacks, MD as Consulting Physician (Ophthalmology) Gatha Mayer, MD as Consulting Physician (Gastroenterology) Nicholaus Bloom, MD as Consulting Physician (Pain Medicine) Jemaine Prokop Otho Darner, NP as Nurse Practitioner (Internal Medicine)  Extended Emergency Contact Information Primary Emergency Contact: [facility] Address: [address]          [place],  [place] Home Phone: [phone] Relation: None  Code Status:  DNR Goals of care: Advanced Directive information Advanced Directives 07/19/2016  Does patient have an advance directive? Yes  Type of Paramedic of [place];Living will;Out of facility DNR (pink MOST or yellow form)  Does patient want to make changes to advanced directive? No - Patient declined  Copy of advanced directive(s) in chart? Yes  Pre-existing out of facility DNR order (yellow form or pink MOST form) -     Chief Complaint  Patient presents with  . Medical Management of Chronic Issues    HPI:  Pt is a 82 y.o. female seen today for medical management of chronic diseases.    R hand contusion, X-ray R hand 07/17/16 no acute fracture or dislocation.   Hx of HTN, normal Bp, off meds. Her chronic back pain is better managed since she is no longer ambulating, scheduled Tylenol is adequate for pain management. She has end stage of dementia, total dependent of her ADLs except feeding self sometimes. Her mood is stable except sometimes she is agitated and combative due to her lack of understanding of surroundings.     Past Medical History:  Diagnosis Date  . Alzheimer's disease   . Backache   . Basal cell carcinoma of face 03/23/2015   Left lower cheek and right upper cheek   . Colon polyp   . Constipation 03/06/2013    03/19/14: Senokot S II qhs. Continue MiraLax qod   . Dementia in chronic affective disorder ([place]) 06/20/2007   Qualifier: Diagnosis of  By: Leanne Chang MD, Bruce  MMSE 9/30 02/07/14 Off Namenda and Aricept 02/26/14 04/02/14 Depakote 250mg  bid 04/07/14 hold Buspirone, Depakote, Tramadol(dc'd Buspirone and changed Tramadol prn subsequently) 12/08/14 reduce Depakote from 250mg  bid to 125mg  am and 250mg  pm.  01/12/15 dc am Depakote and continue 250mg  pm.     . Depression   . Diverticulosis   . Esophageal reflux 12/26/2012  . GERD (gastroesophageal reflux disease)   . Hx of seizure disorder 12/04/2013  . Hyperlipidemia   . Hypertension   . Major depressive disorder, single episode 12/26/2012  . Osteoarthritis   . Osteoporosis    07/23/09 x-ray of the lumbar and thoracic spine showed an L1/L2 mild anterior wedge deformity  . Pterygium of right eye 10/14/2015  . Seizure disorder ([place])   . Spinal stenosis   . Unstable gait    Past Surgical History:  Procedure Laterality Date  . CATARACT EXTRACTION     Dr. Katy Fitch  . COLONOSCOPY  02/01/2002   AVM of the ascending colon, 3 mm polyp, AVM and transverse colon, diverticulosis  . EPIDURAL BLOCK INJECTION  2007  . ESOPHAGOGASTRODUODENOSCOPY  07/05/2005   Esophageal stricture, 4 cm hiatal hernia  . TONSILLECTOMY AND ADENOIDECTOMY Bilateral 1933    Allergies  Allergen Reactions  . Amoxicillin     REACTION: unspecified  . Celebrex [Celecoxib]   .  Codeine Sulfate     REACTION: unspecified  . Naproxen Sodium     REACTION: unspecified  . Reminyl [Galantamine Hydrobromide]   . Versed [Midazolam]   . Vioxx [Rofecoxib]       Medication List       Accurate as of 07/19/16  1:26 PM. Always use your most recent med list.          acetaminophen 325 MG tablet Commonly known as:  TYLENOL Take 650 mg by mouth 3 (three) times daily. Take 2 tabs by mouth daily as needed for pain ( not to exceed 3 gm)   polyethylene glycol packet Commonly known as:  MIRALAX /  GLYCOLAX Take 17 g by mouth daily. Fill cap to 17 gm mark, mix with 4 ounces of fluid and take by mouth daily.   sennosides-docusate sodium 8.6-50 MG tablet Commonly known as:  SENOKOT-S Take 2 tablets by mouth daily.       Review of Systems  Constitutional: Negative for chills, diaphoresis and fever.  HENT: Positive for hearing loss. Negative for congestion, ear discharge, ear pain, nosebleeds, sore throat and tinnitus.   Eyes: Negative for photophobia, pain, discharge and redness.  Respiratory: Negative for cough, shortness of breath, wheezing and stridor.   Cardiovascular: Negative for chest pain, palpitations and leg swelling.  Gastrointestinal: Negative for abdominal pain, blood in stool, constipation, diarrhea, nausea and vomiting.  Endocrine: Negative for polydipsia.  Genitourinary: Positive for frequency. Negative for dysuria, flank pain, hematuria and urgency.       Urinary incontinence  Musculoskeletal: Positive for back pain. Negative for myalgias and neck pain.       Wheelchair dependent. R hand contusion.   Skin: Negative for rash.       Probable basal cell cancer of the right cheek. Left foot bruise, mild swelling, tenderness  Allergic/Immunologic: Negative for environmental allergies.  Neurological: Negative for dizziness, tremors, seizures, weakness and headaches.       Severe dementia  Hematological: Does not bruise/bleed easily.  Psychiatric/Behavioral: Positive for confusion and decreased concentration. Negative for hallucinations and suicidal ideas. The patient is nervous/anxious.     Immunization History  Administered Date(s) Administered  . Influenza Whole 07/06/2007, 06/18/2008  . Influenza-Unspecified 07/30/2014, 06/16/2015, 07/08/2016  . PPD Test 02/14/2009  . Pneumococcal Conjugate-13 09/26/2001  . Zoster 04/11/2007   Pertinent  Health Maintenance Due  Topic Date Due  . DEXA SCAN  03/07/1991  . PNA vac Low Risk Adult (2 of 2 - PPSV23) 03/25/2017  (Originally 09/26/2002)  . INFLUENZA VACCINE  Completed   Fall Risk  03/23/2015  Falls in the past year? No   Functional Status Survey:    Vitals:   07/19/16 1320  BP: 128/66  Pulse: 76  Resp: 18  Temp: 97.8 F (36.6 C)  SpO2: 94%  Weight: 120 lb 8 oz (54.7 kg)  Height: 5\' 1"  (1.549 m)   Body mass index is 22.77 kg/m. Physical Exam  Constitutional: She is oriented to person, place, and time. She appears well-developed and well-nourished. No distress.  HENT:  Head: Normocephalic and atraumatic.  Eyes: EOM are normal. Pupils are equal, round, and reactive to light.     Neck: Normal range of motion. Neck supple. No JVD present. No thyromegaly present.  Cardiovascular: Normal rate and regular rhythm.   No murmur heard. Pulmonary/Chest: Effort normal and breath sounds normal. She has no wheezes. She has no rales.  Abdominal: Soft. Bowel sounds are normal. There is no tenderness.  Musculoskeletal: Normal  range of motion. She exhibits no edema or tenderness.  W/c dependent. Chronic back pain is well managed with Tylenol 650mg  tid. R hand contusion.   Lymphadenopathy:    She has no cervical adenopathy.  Neurological: She is alert and oriented to person, place, and time. She displays normal reflexes. No cranial nerve deficit. She exhibits normal muscle tone. Coordination normal.  Skin: Skin is warm and dry. No rash noted. She is not diaphoretic.  Probable basal cell cancer of the right cheek with some slow growth. Left foot bruise, mild tenderness and swelling.   Psychiatric: Thought content normal. Her mood appears anxious. Her affect is inappropriate. Her speech is tangential. She is agitated and combative. Cognition and memory are impaired. She expresses impulsivity and inappropriate judgment. She exhibits abnormal recent memory and abnormal remote memory.  Grimacing, gnashing teeth, and yelling frequently.    Labs reviewed: No results for input(s): NA, K, CL, CO2, GLUCOSE, BUN,  CREATININE, CALCIUM, MG, PHOS in the last 8760 hours. No results for input(s): AST, ALT, ALKPHOS, BILITOT, PROT, ALBUMIN in the last 8760 hours. No results for input(s): WBC, NEUTROABS, HGB, HCT, MCV, PLT in the last 8760 hours. Lab Results  Component Value Date   TSH 1.48 07/26/2012   No results found for: HGBA1C Lab Results  Component Value Date   CHOL 222 (HH) 10/19/2007   HDL 102.5 10/19/2007   LDLDIRECT 103.3 10/19/2007   TRIG 60 10/19/2007   CHOLHDL 2.2 CALC 10/19/2007    Significant Diagnostic Results in last 30 days:  No results found.  Assessment/Plan Contusion of right hand R hand contusion, X-ray R hand 07/17/16 no acute fracture or dislocation. It should heal.    Essential hypertension Controlled. Not taking antihypertensive agents.  Esophageal reflux Stable, off Famotidine 20mg    Constipation Stable, continue MiraLax qod prn and Senokot S II qhs.    Alzheimer's disease mood is stabilized, off meds  Hx of seizure disorder Continue to be seizure free.    Lower back pain Controlled, takesTylenl 650mg  tid     Family/ staff Communication: continue SNF for care assistance.   Labs/tests ordered:  none

## 2016-07-19 NOTE — Assessment & Plan Note (Signed)
Continue to be seizure free.

## 2016-07-19 NOTE — Assessment & Plan Note (Signed)
R hand contusion, X-ray R hand 07/17/16 no acute fracture or dislocation. It should heal.

## 2016-07-19 NOTE — Assessment & Plan Note (Signed)
Controlled, takesTylenl 650mg tid   

## 2016-07-19 NOTE — Assessment & Plan Note (Signed)
Controlled. Not taking antihypertensive agents.   

## 2016-07-19 NOTE — Assessment & Plan Note (Signed)
mood is stabilized, off meds

## 2016-07-19 NOTE — Assessment & Plan Note (Signed)
Stable, continue MiraLax qod prn and Senokot S II qhs.

## 2016-07-19 NOTE — Assessment & Plan Note (Signed)
Stable, off Famotidine 20mg 

## 2016-08-08 ENCOUNTER — Non-Acute Institutional Stay (SKILLED_NURSING_FACILITY): Payer: Medicare Other | Admitting: Internal Medicine

## 2016-08-08 ENCOUNTER — Encounter: Payer: Self-pay | Admitting: Internal Medicine

## 2016-08-08 DIAGNOSIS — F0280 Dementia in other diseases classified elsewhere without behavioral disturbance: Secondary | ICD-10-CM | POA: Diagnosis not present

## 2016-08-08 DIAGNOSIS — I10 Essential (primary) hypertension: Secondary | ICD-10-CM | POA: Diagnosis not present

## 2016-08-08 DIAGNOSIS — Z8669 Personal history of other diseases of the nervous system and sense organs: Secondary | ICD-10-CM | POA: Diagnosis not present

## 2016-08-08 DIAGNOSIS — G300 Alzheimer's disease with early onset: Secondary | ICD-10-CM | POA: Diagnosis not present

## 2016-08-08 NOTE — Progress Notes (Signed)
Progress Note    Location:  [facility] Room Number: N33 Place of Service:  SNF [phone]) Provider:  Jeanmarie Hubert, MD  Patient Care Team: Estill Dooms, MD as PCP - General (Internal Medicine) Clent Jacks, MD as Consulting Physician (Ophthalmology) Gatha Mayer, MD as Consulting Physician (Gastroenterology) Nicholaus Bloom, MD as Consulting Physician (Pain Medicine) Man Otho Darner, NP as Nurse Practitioner (Internal Medicine)  Extended Emergency Contact Information Primary Emergency Contact: [facility] Address: [address]          [place],  [place] Home Phone: [phone] Relation: None  Code Status:  DNR Goals of care: Advanced Directive information Advanced Directives 08/08/2016  Does patient have an advance directive? Yes  Type of Advance Directive Living will;Healthcare Power of Attorney  Does patient want to make changes to advanced directive? -  Copy of advanced directive(s) in chart? Yes  Pre-existing out of facility DNR order (yellow form or pink MOST form) Yellow form placed in chart (order not valid for inpatient use)     Chief Complaint  Patient presents with  . Medical Management of Chronic Issues    routine visit    HPI:  Pt is a 82 y.o. female seen today for medical management of chronic diseases.    Early onset Alzheimer's dementia without behavioral disturbance - unchanged  Essential hypertension - controlled  Hx of seizure disorder - no seizures since I last saw her     Past Medical History:  Diagnosis Date  . Alzheimer's disease   . Backache   . Basal cell carcinoma of face 03/23/2015   Left lower cheek and right upper cheek   . Colon polyp   . Constipation 03/06/2013   03/19/14: Senokot S II qhs. Continue MiraLax qod   . Dementia in chronic affective disorder ([place]) 06/20/2007   Qualifier: Diagnosis of  By: Leanne Chang MD, Bruce  MMSE 9/30 02/07/14 Off Namenda and Aricept 02/26/14 04/02/14 Depakote 250mg  bid 04/07/14  hold Buspirone, Depakote, Tramadol(dc'd Buspirone and changed Tramadol prn subsequently) 12/08/14 reduce Depakote from 250mg  bid to 125mg  am and 250mg  pm.  01/12/15 dc am Depakote and continue 250mg  pm.     . Depression   . Diverticulosis   . Esophageal reflux 12/26/2012  . GERD (gastroesophageal reflux disease)   . Hx of seizure disorder 12/04/2013  . Hyperlipidemia   . Hypertension   . Major depressive disorder, single episode 12/26/2012  . Osteoarthritis   . Osteoporosis    07/23/09 x-ray of the lumbar and thoracic spine showed an L1/L2 mild anterior wedge deformity  . Pterygium of right eye 10/14/2015  . Seizure disorder ([place])   . Spinal stenosis   . Unstable gait    Past Surgical History:  Procedure Laterality Date  . CATARACT EXTRACTION     Dr. Katy Fitch  . COLONOSCOPY  02/01/2002   AVM of the ascending colon, 3 mm polyp, AVM and transverse colon, diverticulosis  . EPIDURAL BLOCK INJECTION  2007  . ESOPHAGOGASTRODUODENOSCOPY  07/05/2005   Esophageal stricture, 4 cm hiatal hernia  . TONSILLECTOMY AND ADENOIDECTOMY Bilateral 1933    Allergies  Allergen Reactions  . Amoxicillin     REACTION: unspecified  . Celebrex [Celecoxib]   . Codeine Sulfate     REACTION: unspecified  . Naproxen Sodium     REACTION: unspecified  . Reminyl [Galantamine Hydrobromide]   . Versed [Midazolam]   . Vioxx [Rofecoxib]       Medication List  Accurate as of 08/08/16  2:42 PM. Always use your most recent med list.          acetaminophen 325 MG tablet Commonly known as:  TYLENOL Take 650 mg by mouth 3 (three) times daily. Take 2 tabs by mouth daily as needed for pain ( not to exceed 3 gm)   polyethylene glycol packet Commonly known as:  MIRALAX / GLYCOLAX Take 17 g by mouth daily. Fill cap to 17 gm mark, mix with 4 ounces of fluid and take by mouth daily.   sennosides-docusate sodium 8.6-50 MG tablet Commonly known as:  SENOKOT-S Take 2 tablets by mouth daily.       Review of  Systems  Constitutional: Negative for chills, diaphoresis and fever.  HENT: Positive for hearing loss. Negative for congestion, ear discharge, ear pain, nosebleeds, sore throat and tinnitus.   Eyes: Negative for photophobia, pain, discharge and redness.  Respiratory: Negative for cough, shortness of breath, wheezing and stridor.   Cardiovascular: Negative for chest pain, palpitations and leg swelling.  Gastrointestinal: Negative for abdominal pain, blood in stool, constipation, diarrhea, nausea and vomiting.  Endocrine: Negative for polydipsia.  Genitourinary: Positive for frequency. Negative for dysuria, flank pain, hematuria and urgency.       Urinary incontinence  Musculoskeletal: Positive for back pain. Negative for myalgias and neck pain.       Wheelchair dependent. R hand contusion.   Skin: Negative for rash.       keratoacanthoma of the right cheek.   Allergic/Immunologic: Negative for environmental allergies.  Neurological: Negative for dizziness, tremors, seizures, weakness and headaches.       Severe dementia  Hematological: Does not bruise/bleed easily.  Psychiatric/Behavioral: Positive for confusion and decreased concentration. Negative for hallucinations and suicidal ideas. The patient is nervous/anxious.     Immunization History  Administered Date(s) Administered  . Influenza Whole 07/06/2007, 06/18/2008  . Influenza-Unspecified 07/30/2014, 06/16/2015, 07/08/2016  . PPD Test 02/14/2009  . Pneumococcal Conjugate-13 09/26/2001  . Zoster 04/11/2007   Pertinent  Health Maintenance Due  Topic Date Due  . DEXA SCAN  03/07/1991  . PNA vac Low Risk Adult (2 of 2 - PPSV23) 03/25/2017 (Originally 09/26/2002)  . INFLUENZA VACCINE  Completed   Fall Risk  03/23/2015  Falls in the past year? No   Vitals:   08/08/16 1437  BP: 140/82  Pulse: 62  Resp: 18  Temp: 98.3 F (36.8 C)  SpO2: 95%  Weight: 120 lb (54.4 kg)  Height: 5\' 1"  (1.549 m)   Body mass index is 22.67  kg/m. Physical Exam  Constitutional: She appears well-developed and well-nourished. No distress.  HENT:  Head: Normocephalic and atraumatic.  Eyes: EOM are normal. Pupils are equal, round, and reactive to light.     Neck: Normal range of motion. Neck supple. No JVD present. No thyromegaly present.  Cardiovascular: Normal rate and regular rhythm.   No murmur heard. Pulmonary/Chest: Effort normal and breath sounds normal. She has no wheezes. She has no rales.  Abdominal: Soft. Bowel sounds are normal. There is no tenderness.  Musculoskeletal: Normal range of motion. She exhibits no edema or tenderness.  W/c dependent. Chronic back pain is well managed with Tylenol 650mg  tid.  Lymphadenopathy:    She has no cervical adenopathy.  Neurological: She is alert. She displays normal reflexes. No cranial nerve deficit. She exhibits normal muscle tone. Coordination normal.  Severe dementia  Skin: Skin is warm and dry. No rash noted. She is not diaphoretic.  Probable  basal cell cancer of the right cheek with some slow growth. Left foot bruise, mild tenderness and swelling.   Psychiatric: Thought content normal. Her mood appears anxious. Her affect is inappropriate. Her speech is tangential. She is agitated and combative. Cognition and memory are impaired. She expresses impulsivity and inappropriate judgment. She exhibits abnormal recent memory and abnormal remote memory.  Grimacing, gnashing teeth, and yelling frequently.    Labs reviewed: No results for input(s): NA, K, CL, CO2, GLUCOSE, BUN, CREATININE, CALCIUM, MG, PHOS in the last 8760 hours. No results for input(s): AST, ALT, ALKPHOS, BILITOT, PROT, ALBUMIN in the last 8760 hours. No results for input(s): WBC, NEUTROABS, HGB, HCT, MCV, PLT in the last 8760 hours. Lab Results  Component Value Date   TSH 1.48 07/26/2012   No results found for: HGBA1C Lab Results  Component Value Date   CHOL 222 (HH) 10/19/2007   HDL 102.5 10/19/2007    LDLDIRECT 103.3 10/19/2007   TRIG 60 10/19/2007   CHOLHDL 2.2 CALC 10/19/2007    Assessment/Plan 1. Early onset Alzheimer's dementia without behavioral disturbance unchanged  2. Essential hypertension controlled  3. Hx of seizure disorder None in several months

## 2016-09-26 DEATH — deceased
